# Patient Record
Sex: Female | Born: 1985 | Race: White | Hispanic: No | Marital: Single | Smoking: Former smoker
Health system: Southern US, Community
[De-identification: ages and names within clinical notes are randomized; demographics above are authoritative.]

## PROBLEM LIST (undated history)

## (undated) DIAGNOSIS — D649 Anemia, unspecified: Secondary | ICD-10-CM

## (undated) DIAGNOSIS — F329 Major depressive disorder, single episode, unspecified: Secondary | ICD-10-CM

## (undated) DIAGNOSIS — F32A Depression, unspecified: Secondary | ICD-10-CM

## (undated) DIAGNOSIS — N809 Endometriosis, unspecified: Secondary | ICD-10-CM

## (undated) DIAGNOSIS — F419 Anxiety disorder, unspecified: Secondary | ICD-10-CM

## (undated) DIAGNOSIS — J189 Pneumonia, unspecified organism: Secondary | ICD-10-CM

## (undated) HISTORY — PX: TONSILLECTOMY: SUR1361

## (undated) HISTORY — PX: EXCISION OF ENDOMETRIOMA: SHX6473

---

## 2012-06-30 HISTORY — PX: WISDOM TOOTH EXTRACTION: SHX21

## 2013-07-22 ENCOUNTER — Other Ambulatory Visit (HOSPITAL_COMMUNITY)
Admission: RE | Admit: 2013-07-22 | Discharge: 2013-07-22 | Disposition: A | Discharge status: Home / Self Care | Payer: BC Managed Care – PPO | Source: Ambulatory Visit | Attending: Physician Assistant | Admitting: Physician Assistant

## 2013-07-22 DIAGNOSIS — Z1151 Encounter for screening for human papillomavirus (HPV): Secondary | ICD-10-CM | POA: Insufficient documentation

## 2013-07-22 DIAGNOSIS — Z124 Encounter for screening for malignant neoplasm of cervix: Secondary | ICD-10-CM | POA: Insufficient documentation

## 2014-06-30 DIAGNOSIS — J189 Pneumonia, unspecified organism: Secondary | ICD-10-CM

## 2014-06-30 HISTORY — DX: Pneumonia, unspecified organism: J18.9

## 2015-06-28 ENCOUNTER — Encounter (HOSPITAL_COMMUNITY): Payer: Self-pay | Admitting: Emergency Medicine

## 2015-06-28 ENCOUNTER — Inpatient Hospital Stay (HOSPITAL_COMMUNITY)
Admission: AD | Admit: 2015-06-28 | Discharge: 2015-07-01 | DRG: 760 | Disposition: A | Discharge status: Home / Self Care | Payer: BLUE CROSS/BLUE SHIELD | Attending: Obstetrics & Gynecology | Admitting: Obstetrics & Gynecology

## 2015-06-28 ENCOUNTER — Emergency Department (HOSPITAL_COMMUNITY): Payer: BLUE CROSS/BLUE SHIELD

## 2015-06-28 DIAGNOSIS — R102 Pelvic and perineal pain unspecified side: Secondary | ICD-10-CM

## 2015-06-28 DIAGNOSIS — R1031 Right lower quadrant pain: Secondary | ICD-10-CM | POA: Diagnosis not present

## 2015-06-28 DIAGNOSIS — A047 Enterocolitis due to Clostridium difficile: Secondary | ICD-10-CM | POA: Diagnosis present

## 2015-06-28 DIAGNOSIS — Y95 Nosocomial condition: Secondary | ICD-10-CM | POA: Diagnosis present

## 2015-06-28 DIAGNOSIS — D72829 Elevated white blood cell count, unspecified: Secondary | ICD-10-CM

## 2015-06-28 DIAGNOSIS — Z87891 Personal history of nicotine dependence: Secondary | ICD-10-CM

## 2015-06-28 DIAGNOSIS — N80129 Deep endometriosis of ovary, unspecified ovary: Secondary | ICD-10-CM | POA: Diagnosis present

## 2015-06-28 DIAGNOSIS — J189 Pneumonia, unspecified organism: Secondary | ICD-10-CM | POA: Diagnosis present

## 2015-06-28 DIAGNOSIS — B9689 Other specified bacterial agents as the cause of diseases classified elsewhere: Secondary | ICD-10-CM

## 2015-06-28 DIAGNOSIS — IMO0002 Reserved for concepts with insufficient information to code with codable children: Secondary | ICD-10-CM

## 2015-06-28 DIAGNOSIS — N760 Acute vaginitis: Secondary | ICD-10-CM

## 2015-06-28 DIAGNOSIS — N83201 Unspecified ovarian cyst, right side: Secondary | ICD-10-CM

## 2015-06-28 DIAGNOSIS — N83202 Unspecified ovarian cyst, left side: Secondary | ICD-10-CM

## 2015-06-28 DIAGNOSIS — R1030 Lower abdominal pain, unspecified: Secondary | ICD-10-CM

## 2015-06-28 DIAGNOSIS — N730 Acute parametritis and pelvic cellulitis: Secondary | ICD-10-CM | POA: Diagnosis present

## 2015-06-28 DIAGNOSIS — R112 Nausea with vomiting, unspecified: Secondary | ICD-10-CM

## 2015-06-28 DIAGNOSIS — N801 Endometriosis of ovary: Secondary | ICD-10-CM | POA: Diagnosis not present

## 2015-06-28 HISTORY — DX: Major depressive disorder, single episode, unspecified: F32.9

## 2015-06-28 HISTORY — DX: Depression, unspecified: F32.A

## 2015-06-28 HISTORY — DX: Endometriosis, unspecified: N80.9

## 2015-06-28 LAB — COMPREHENSIVE METABOLIC PANEL
ALBUMIN: 4.5 g/dL (ref 3.5–5.0)
ALT: 15 U/L (ref 14–54)
ANION GAP: 11 (ref 5–15)
AST: 20 U/L (ref 15–41)
Alkaline Phosphatase: 55 U/L (ref 38–126)
BUN: 11 mg/dL (ref 6–20)
CO2: 25 mmol/L (ref 22–32)
Calcium: 9.1 mg/dL (ref 8.9–10.3)
Chloride: 102 mmol/L (ref 101–111)
Creatinine, Ser: 0.96 mg/dL (ref 0.44–1.00)
GFR calc non Af Amer: >60 mL/min (ref >60)
GLUCOSE: 114 mg/dL — AB (ref 65–99)
POTASSIUM: 4 mmol/L (ref 3.5–5.1)
SODIUM: 138 mmol/L (ref 135–145)
Total Bilirubin: 0.5 mg/dL (ref 0.3–1.2)
Total Protein: 7.8 g/dL (ref 6.5–8.1)

## 2015-06-28 LAB — DIFFERENTIAL
Basophils Absolute: 0 10*3/uL (ref 0.0–0.1)
Basophils Relative: 0 %
EOS ABS: 0.1 10*3/uL (ref 0.0–0.7)
EOS PCT: 1 %
Lymphocytes Relative: 23 %
Lymphs Abs: 2.8 10*3/uL (ref 0.7–4.0)
MONO ABS: 0.4 10*3/uL (ref 0.1–1.0)
Monocytes Relative: 4 %
NEUTROS PCT: 72 %
Neutro Abs: 8.8 10*3/uL — ABNORMAL HIGH (ref 1.7–7.7)

## 2015-06-28 LAB — URINALYSIS, ROUTINE W REFLEX MICROSCOPIC
BILIRUBIN URINE: NEGATIVE
Glucose, UA: NEGATIVE mg/dL
Ketones, ur: 15 mg/dL — AB
Leukocytes, UA: NEGATIVE
NITRITE: NEGATIVE
PH: 5.5 (ref 5.0–8.0)
Protein, ur: NEGATIVE mg/dL
SPECIFIC GRAVITY, URINE: 1.036 — AB (ref 1.005–1.030)

## 2015-06-28 LAB — CBC
HEMATOCRIT: 42.8 % (ref 36.0–46.0)
HEMOGLOBIN: 14.1 g/dL (ref 12.0–15.0)
MCH: 28.3 pg (ref 26.0–34.0)
MCHC: 32.9 g/dL (ref 30.0–36.0)
MCV: 85.9 fL (ref 78.0–100.0)
Platelets: 357 10*3/uL (ref 150–400)
RBC: 4.98 MIL/uL (ref 3.87–5.11)
RDW: 12.7 % (ref 11.5–15.5)
WBC: 12.7 10*3/uL — ABNORMAL HIGH (ref 4.0–10.5)

## 2015-06-28 LAB — URINE MICROSCOPIC-ADD ON

## 2015-06-28 LAB — I-STAT BETA HCG BLOOD, ED (MC, WL, AP ONLY): I-stat hCG, quantitative: <5 m[IU]/mL (ref <5)

## 2015-06-28 LAB — WET PREP, GENITAL
Sperm: NONE SEEN
TRICH WET PREP: NONE SEEN
YEAST WET PREP: NONE SEEN

## 2015-06-28 LAB — LIPASE, BLOOD: LIPASE: 27 U/L (ref 11–51)

## 2015-06-28 MED ORDER — IOHEXOL 300 MG/ML  SOLN
100.0000 mL | Freq: Once | INTRAMUSCULAR | Status: AC | PRN
  Administered 2015-06-28: 100 mL via INTRAVENOUS

## 2015-06-28 MED ORDER — HYDROMORPHONE HCL 1 MG/ML IJ SOLN
1.0000 mg | Freq: Once | INTRAMUSCULAR | Status: AC
  Administered 2015-06-28: 1 mg via INTRAVENOUS
  Filled 2015-06-28: qty 1

## 2015-06-28 MED ORDER — MORPHINE SULFATE (PF) 4 MG/ML IV SOLN
4.0000 mg | Freq: Once | INTRAVENOUS | Status: AC
  Administered 2015-06-28: 4 mg via INTRAVENOUS
  Filled 2015-06-28: qty 1

## 2015-06-28 MED ORDER — SODIUM CHLORIDE 0.9 % IV BOLUS (SEPSIS)
1000.0000 mL | Freq: Once | INTRAVENOUS | Status: AC
  Administered 2015-06-28: 1000 mL via INTRAVENOUS

## 2015-06-28 MED ORDER — ONDANSETRON HCL 4 MG/2ML IJ SOLN
4.0000 mg | Freq: Once | INTRAMUSCULAR | Status: AC
  Administered 2015-06-28: 4 mg via INTRAVENOUS
  Filled 2015-06-28: qty 2

## 2015-06-28 NOTE — ED Notes (Signed)
Spoke with lab. States she will add differential blood test to blood previously sent down to the lab.

## 2015-06-28 NOTE — ED Notes (Signed)
Pt c/o stabbing low abdominal pain, emesis, onset 1 hour ago. No urinary symptoms.

## 2015-06-28 NOTE — ED Provider Notes (Signed)
CSN: [id number]     Arrival date & time 06/28/15  1841 History   First MD Initiated Contact with Patient 06/28/15 2107     Chief Complaint  Patient presents with  . Abdominal Pain     (Consider location/radiation/quality/duration/timing/severity/associated sxs/prior Treatment) HPI Comments: Selena Mann is a 29 y.o. female with a PMHx of endometriosis, who presents to the ED with complaints of sudden onset lower abdominal pain 3 hours. She states the pain is 7/10 constant stabbing across the lower abdomen and somewhat into the right upper quadrant, nonradiating, worse with sitting or movements, with no treatments tried prior to arrival. Associated symptoms include nausea, 2 episodes of nonbloody nonbilious emesis, and decreased passage of flatus since onset of symptoms. LMP was 2-3 weeks ago. She is sexually active with one female partner. Last meal was at 2 PM when she ate leftover Lamb and pita bread. She denies any fevers, chills, chest pain, shortness breath, diarrhea, constipation, melena, hematochezia, hematemesis, dysuria, hematuria, flank pain, vaginal bleeding or discharge, numbness, tingling, weakness, recent travel, recent antibiotic use, sick contacts, suspicious food intake, alcohol use, NSAID use, or prior abdominal surgeries.  Patient is a 29 y.o. female presenting with abdominal pain. The history is provided by the patient. No language interpreter was used.  Abdominal Pain Pain location:  LLQ, RLQ, suprapubic and RUQ Pain quality: stabbing   Pain radiates to:  Does not radiate Pain severity:  Moderate Onset quality:  Sudden Duration:  3 hours Timing:  Constant Progression:  Unchanged Chronicity:  New Context: not recent travel, not sick contacts and not suspicious food intake   Relieved by:  None tried Worsened by:  Movement Ineffective treatments:  None tried Associated symptoms: flatus (no flatus since onset), nausea and vomiting   Associated symptoms: no chest pain,  no chills, no constipation, no diarrhea, no dysuria, no fever, no hematemesis, no hematochezia, no hematuria, no melena, no shortness of breath, no vaginal bleeding and no vaginal discharge   Risk factors: no alcohol abuse, has not had multiple surgeries and no NSAID use     Past Medical History  Diagnosis Date  . Endometriosis    Past Surgical History  Procedure Laterality Date  . Tonsillectomy     History reviewed. No pertinent family history. Social History  Substance Use Topics  . Smoking status: Former Games developer  . Smokeless tobacco: None  . Alcohol Use: Yes     Comment: occasional   OB History    No data available     Review of Systems  Constitutional: Negative for fever and chills.  Respiratory: Negative for shortness of breath.   Cardiovascular: Negative for chest pain.  Gastrointestinal: Positive for nausea, vomiting, abdominal pain and flatus (no flatus since onset). Negative for diarrhea, constipation, blood in stool, melena, hematochezia and hematemesis.       +No flatus passed since onset of symptoms  Genitourinary: Negative for dysuria, hematuria, flank pain, vaginal bleeding and vaginal discharge.  Musculoskeletal: Negative for myalgias and arthralgias.  Skin: Negative for color change.  Allergic/Immunologic: Negative for immunocompromised state.  Neurological: Negative for weakness and numbness.  Psychiatric/Behavioral: Negative for confusion.   10 Systems reviewed and are negative for acute change except as noted in the HPI.    Allergies  Review of patient's allergies indicates no known allergies.  Home Medications   Prior to Admission medications   Not on File   BP 101/87 mmHg  Pulse 97  Temp(Src) 97.5 F (36.4 C) (Oral)  Resp 18  SpO2 99% Physical Exam  Constitutional: She is oriented to person, place, and time. Vital signs are normal. She appears well-developed and well-nourished.  Non-toxic appearance. No distress.  Afebrile, nontoxic, NAD    HENT:  Head: Normocephalic and atraumatic.  Mouth/Throat: Oropharynx is clear and moist and mucous membranes are normal.  Eyes: Conjunctivae and EOM are normal. Right eye exhibits no discharge. Left eye exhibits no discharge.  Neck: Normal range of motion. Neck supple.  Cardiovascular: Normal rate, regular rhythm, normal heart sounds and intact distal pulses.  Exam reveals no gallop and no friction rub.   No murmur heard. Pulmonary/Chest: Effort normal and breath sounds normal. No respiratory distress. She has no decreased breath sounds. She has no wheezes. She has no rhonchi. She has no rales.  Abdominal: Soft. Normal appearance and bowel sounds are normal. She exhibits no distension. There is tenderness in the right lower quadrant, suprapubic area and left lower quadrant. There is guarding (voluntary) and tenderness at McBurney's point. There is no rigidity, no rebound, no CVA tenderness and negative Murphy's sign.    Soft, nondistended, +BS throughout, with diffuse lower abdominal TTP most focally in the RLQ at mcburney's point with some voluntary guarding but no rebound or rigidity, +rovsing's sign, neg murphy's, no CVA TTP   Genitourinary: Uterus normal. Pelvic exam was performed with patient supine. There is no rash, tenderness or lesion on the right labia. There is no rash, tenderness or lesion on the left labia. Cervix exhibits no motion tenderness, no discharge and no friability. Right adnexum displays tenderness and fullness. Right adnexum displays no mass. Left adnexum displays tenderness and fullness. Left adnexum displays no mass. No erythema, tenderness or bleeding in the vagina. No signs of injury around the vagina. Vaginal discharge (scant thin clear) found.  Chaperone present for exam. No rashes, lesions, or tenderness to external genitalia. No erythema, injury, or tenderness to vaginal mucosa. Scan thin clear vaginal discharge without bleeding within vaginal vault. No adnexal masses  palpable although exam limited due to pain, moderate b/l adnexal tenderness and some fullness. No CMT, cervical friability, or discharge from cervical os. Uterus non-deviated, mobile, nonTTP, and without enlargement.    Musculoskeletal: Normal range of motion.  Neurological: She is alert and oriented to person, place, and time. She has normal strength. No sensory deficit.  Skin: Skin is warm, dry and intact. No rash noted.  Psychiatric: She has a normal mood and affect.  Nursing note and vitals reviewed.   ED Course  Procedures (including critical care time) Labs Review Labs Reviewed  WET PREP, GENITAL - Abnormal; Notable for the following:    Clue Cells Wet Prep HPF POC MANY (*)    WBC, Wet Prep HPF POC MANY (*)    All other components within normal limits  COMPREHENSIVE METABOLIC PANEL - Abnormal; Notable for the following:    Glucose, Bld 114 (*)    All other components within normal limits  CBC - Abnormal; Notable for the following:    WBC 12.7 (*)    All other components within normal limits  URINALYSIS, ROUTINE W REFLEX MICROSCOPIC (NOT AT [facility]) - Abnormal; Notable for the following:    Specific Gravity, Urine 1.036 (*)    Hgb urine dipstick SMALL (*)    Ketones, ur 15 (*)    All other components within normal limits  DIFFERENTIAL - Abnormal; Notable for the following:    Neutro Abs 8.8 (*)    All other components within normal limits  URINE  MICROSCOPIC-ADD ON - Abnormal; Notable for the following:    Squamous Epithelial / LPF 6-30 (*)    Bacteria, UA FEW (*)    All other components within normal limits  LIPASE, BLOOD  I-STAT BETA HCG BLOOD, ED ([facility], [facility], [facility] ONLY)  GC/CHLAMYDIA PROBE AMP ([place]) NOT AT [facility]  [facility] Imaging Review US Transvaginal Non-ob  06/29/2015  CLINICAL DATA:  Bilateral pelvic cystic masses noted on CT. Further evaluation requested. Initial encounter. EXAM: TRANSABDOMINAL AND TRANSVAGINAL ULTRASOUND OF PELVIS DOPPLER ULTRASOUND OF OVARIES TECHNIQUE:  Both transabdominal and transvaginal ultrasound examinations of the pelvis were performed. Transabdominal technique was performed for global imaging of the pelvis including uterus, ovaries, adnexal regions, and pelvic cul-de-sac. It was necessary to proceed with endovaginal exam following the transabdominal exam to visualize the ovaries in greater detail. Color and duplex Doppler ultrasound was utilized to evaluate blood flow to the ovaries. COMPARISON:  CT of the abdomen and pelvis performed 06/28/2015 FINDINGS: Uterus Measurements: 9.6 x 3.5 x 4.4 cm. No fibroids or other mass visualized. Endometrium Thickness: 1.2 cm.  No focal abnormality visualized. Right ovary Measurements: 6.3 x 5.6 x 5.3 cm. There is a large echogenic region within the right ovary, measuring 4.4 x 4.2 x 4.2 cm, which may reflect an endometrioma or possibly a hemorrhagic cyst. Left ovary Measurements: 13.0 x 3.6 x 6.1 cm. There is a larger complex cystic lesion at the left ovary, measuring 11.9 x 3.3 x 5.0 cm, with layering increased attenuation, most likely reflecting a large organizing hemorrhagic cyst. Pulsed Doppler evaluation of both ovaries demonstrates normal low-resistance arterial and venous waveforms. Other findings Trace free fluid seen within the pelvic cul-de-sac, likely physiologic in nature. IMPRESSION: Echogenic region at the right ovary, measuring 4.4 cm, and a larger complex cystic lesion at the left ovary, measuring 11.9 x 3.3 x 5.0 cm. The left-sided lesion reflects a large organizing hemorrhagic cyst. The right ovarian region may reflect an endometrioma or hemorrhagic cyst. Would consider follow-up pelvic ultrasound in 2-3 months to assess for gradual resolution. Electronically Signed   By: Roanna Raider M.D.   On: 06/29/2015 00:58   US Pelvis Complete  06/29/2015  CLINICAL DATA:  Bilateral pelvic cystic masses noted on CT. Further evaluation requested. Initial encounter. EXAM: TRANSABDOMINAL AND TRANSVAGINAL  ULTRASOUND OF PELVIS DOPPLER ULTRASOUND OF OVARIES TECHNIQUE: Both transabdominal and transvaginal ultrasound examinations of the pelvis were performed. Transabdominal technique was performed for global imaging of the pelvis including uterus, ovaries, adnexal regions, and pelvic cul-de-sac. It was necessary to proceed with endovaginal exam following the transabdominal exam to visualize the ovaries in greater detail. Color and duplex Doppler ultrasound was utilized to evaluate blood flow to the ovaries. COMPARISON:  CT of the abdomen and pelvis performed 06/28/2015 FINDINGS: Uterus Measurements: 9.6 x 3.5 x 4.4 cm. No fibroids or other mass visualized. Endometrium Thickness: 1.2 cm.  No focal abnormality visualized. Right ovary Measurements: 6.3 x 5.6 x 5.3 cm. There is a large echogenic region within the right ovary, measuring 4.4 x 4.2 x 4.2 cm, which may reflect an endometrioma or possibly a hemorrhagic cyst. Left ovary Measurements: 13.0 x 3.6 x 6.1 cm. There is a larger complex cystic lesion at the left ovary, measuring 11.9 x 3.3 x 5.0 cm, with layering increased attenuation, most likely reflecting a large organizing hemorrhagic cyst. Pulsed Doppler evaluation of both ovaries demonstrates normal low-resistance arterial and venous waveforms. Other findings Trace free fluid seen within the pelvic cul-de-sac, likely physiologic in nature.  IMPRESSION: Echogenic region at the right ovary, measuring 4.4 cm, and a larger complex cystic lesion at the left ovary, measuring 11.9 x 3.3 x 5.0 cm. The left-sided lesion reflects a large organizing hemorrhagic cyst. The right ovarian region may reflect an endometrioma or hemorrhagic cyst. Would consider follow-up pelvic ultrasound in 2-3 months to assess for gradual resolution. Electronically Signed   By: Roanna Raider M.D.   On: 06/29/2015 00:58   Ct Abdomen Pelvis W Contrast  06/28/2015  CLINICAL DATA:  29 year old female with right lower quadrant abdominal pain EXAM:  CT ABDOMEN AND PELVIS WITH CONTRAST TECHNIQUE: Multidetector CT imaging of the abdomen and pelvis was performed using the standard protocol following bolus administration of intravenous contrast. CONTRAST:  OMNIPAQUE IOHEXOL 300 MG/ML  SOLN COMPARISON:  None. FINDINGS: The visualized lung bases are clear. No intra-abdominal free air. Small free fluid. The liver, gallbladder, pancreas, spleen, adrenal glands, left kidney, visualized ureters, and urinary bladder appear unremarkable. There is a 3 mm nonobstructing right renal upper pole calculus. No hydronephrosis. The uterus is anteverted. There fluid containing structures within the pelvis presumably ovarian cyst measuring 6.1 x 5.3 cm on the right and 4.2 x 9.3 cm on the left. The left ovarian cyst demonstrates a thickened wall with smaller adjacent cystic lesions. There is inflammatory changes and stranding of the pelvic floor fat with extension of the inflammatory fluid throughout the abdomen. Findings may represent an infected complex ovarian cysts versus less likely hydrosalpinx. Correlation with clinical exam recommend. Pelvic ultrasound with Doppler interrogation of the ovaries is recommended for further evaluation and exclude associated ovarian torsion. MRI may provide additional information pending ultrasound findings. There is no evidence of bowel obstruction or inflammation. Normal appendix. The abdominal aorta and IVC appear patent. No portal venous gas identified. There is no adenopathy. Small fat containing umbilical hernia. The abdominal wall soft tissues appear unremarkable. The osseous structures are intact. IMPRESSION: Bilateral pelvic cystic masses with associated inflammatory changes. Findings may represent complex an infected ovarian cyst versus less likely pyosalpinx. Pelvic ultrasound with Doppler interrogation of the ovaries is recommended for better evaluation of the cystic lesions and documentation of flow within the ovaries. MRI may  provide additional evaluation depending on sonographic findings. No evidence of bowel obstruction or inflammation.  Normal appendix. Electronically Signed   By: Elgie Collard M.D.   On: 06/28/2015 23:13   [place] Art/ven Flow Abd Pelv Doppler  06/29/2015  CLINICAL DATA:  Bilateral pelvic cystic masses noted on CT. Further evaluation requested. Initial encounter. EXAM: TRANSABDOMINAL AND TRANSVAGINAL ULTRASOUND OF PELVIS DOPPLER ULTRASOUND OF OVARIES TECHNIQUE: Both transabdominal and transvaginal ultrasound examinations of the pelvis were performed. Transabdominal technique was performed for global imaging of the pelvis including uterus, ovaries, adnexal regions, and pelvic cul-de-sac. It was necessary to proceed with endovaginal exam following the transabdominal exam to visualize the ovaries in greater detail. Color and duplex Doppler ultrasound was utilized to evaluate blood flow to the ovaries. COMPARISON:  CT of the abdomen and pelvis performed 06/28/2015 FINDINGS: Uterus Measurements: 9.6 x 3.5 x 4.4 cm. No fibroids or other mass visualized. Endometrium Thickness: 1.2 cm.  No focal abnormality visualized. Right ovary Measurements: 6.3 x 5.6 x 5.3 cm. There is a large echogenic region within the right ovary, measuring 4.4 x 4.2 x 4.2 cm, which may reflect an endometrioma or possibly a hemorrhagic cyst. Left ovary Measurements: 13.0 x 3.6 x 6.1 cm. There is a larger complex cystic lesion at the left ovary, measuring 11.9  x 3.3 x 5.0 cm, with layering increased attenuation, most likely reflecting a large organizing hemorrhagic cyst. Pulsed Doppler evaluation of both ovaries demonstrates normal low-resistance arterial and venous waveforms. Other findings Trace free fluid seen within the pelvic cul-de-sac, likely physiologic in nature. IMPRESSION: Echogenic region at the right ovary, measuring 4.4 cm, and a larger complex cystic lesion at the left ovary, measuring 11.9 x 3.3 x 5.0 cm. The left-sided lesion  reflects a large organizing hemorrhagic cyst. The right ovarian region may reflect an endometrioma or hemorrhagic cyst. Would consider follow-up pelvic ultrasound in 2-3 months to assess for gradual resolution. Electronically Signed   By: Roanna Raider M.D.   On: 06/29/2015 00:58   I have personally reviewed and evaluated these images and lab results as part of my medical decision-making.   EKG Interpretation None      MDM   Final diagnoses:  Lower abdominal pain  RLQ abdominal pain  Non-intractable vomiting with nausea, vomiting of unspecified type  Leukocytosis  Pelvic cyst  Adnexal tenderness  BV (bacterial vaginosis)    29 y.o. female with sudden onset lower abd pain mostly in RLQ and n/v. No passage of flatus since onset of symptoms. On exam, diffuse lower abd tenderness with voluntary guarding, tenderness over mcburney's point. Beta HCG neg, lipase WNL, CMP WNL, and CBC showing leukocytosis of 12.7, will obtain differential to aid in evaluation. Discussed that this could be appendicitis vs pelvic etiology vs obstruction/perf, etc. Will proceed with CT imaging to eval for appendicitis. If CT neg, discussed that we'd need to proceed with pelvic exam to determine any other causes (i.e. Ovarian torsion, etc). Will give pain meds, fluids, and nausea meds. Will await CT and U/A, then reassess shortly.   11:38 PM U/A still pending. Differential returning showing ab neut elevated at 8.8 but no other changes, including no changes to neutrophil%. CT showing b/l pelvic cystic masses with inflammatory changes around these, which could be infected cysts vs pyosalpinx vs complex cysts. Pelvic exam performed which showed scant thin clear vaginal discharge without cervical friability or CMT, mildly tender in b/l adnexa. Will obtain pelvic u/s to further evaluate cysts and to eval for torsion. Will give more pain meds and reassess shortly.  12:01 AM Wet prep showing many clue cells and WBCs, no  yeast or trichomonas. Pt would potentially benefit from flagyl for BV although she's not having large quantity discharge or having symptoms of BV, but given current adnexal pain will likely treat. Pt is not sexually active with males, doubt GC/CT, will defer on treatment of these until results return and there is a known GC/CT infection. U/A with neg nitrites and leuks, 6-30 squamous, 0-5 WBC and few bacteria. Doubt UTI/pyelo. Pt in ultrasound now, will await results.   12:58 AM Care signed out to [facility] PA-C at shift change. U/S results pending. Please see nicole's notes for further documentation of care and dispo.  BP 133/78 mmHg  Pulse 97  Temp(Src) 98 F (36.7 C) (Oral)  Resp 20  SpO2 99%  Meds ordered this encounter  Medications  . sodium chloride 0.9 % bolus 1,000 mL    Sig:   . ondansetron (ZOFRAN) injection 4 mg    Sig:   . morphine 4 MG/ML injection 4 mg    Sig:   . HYDROmorphone (DILAUDID) injection 1 mg    Sig:   . iohexol (OMNIPAQUE) 300 MG/ML solution 100 mL    Sig:   . HYDROmorphone (DILAUDID) injection  1 mg    Sig:   . HYDROmorphone (DILAUDID) injection 1 mg    Sig:      Yuleni Burich Camprubi-Soms, PA-C 06/29/15 0102  Lavera Guise, MD 06/29/15 [id number]

## 2015-06-28 NOTE — ED Notes (Signed)
Patient transported to CT 

## 2015-06-28 NOTE — ED Notes (Signed)
PA at bedside.

## 2015-06-29 ENCOUNTER — Encounter (HOSPITAL_COMMUNITY): Payer: Self-pay

## 2015-06-29 DIAGNOSIS — Z87891 Personal history of nicotine dependence: Secondary | ICD-10-CM

## 2015-06-29 DIAGNOSIS — R1031 Right lower quadrant pain: Secondary | ICD-10-CM | POA: Diagnosis present

## 2015-06-29 DIAGNOSIS — A047 Enterocolitis due to Clostridium difficile: Secondary | ICD-10-CM | POA: Diagnosis not present

## 2015-06-29 DIAGNOSIS — N730 Acute parametritis and pelvic cellulitis: Secondary | ICD-10-CM | POA: Diagnosis present

## 2015-06-29 DIAGNOSIS — J189 Pneumonia, unspecified organism: Secondary | ICD-10-CM | POA: Diagnosis not present

## 2015-06-29 DIAGNOSIS — Y95 Nosocomial condition: Secondary | ICD-10-CM | POA: Diagnosis present

## 2015-06-29 DIAGNOSIS — N801 Endometriosis of ovary: Secondary | ICD-10-CM | POA: Diagnosis not present

## 2015-06-29 LAB — GENTAMICIN LEVEL, RANDOM: Gentamicin Rm: 1.7 ug/mL

## 2015-06-29 MED ORDER — HYDROMORPHONE HCL 1 MG/ML IJ SOLN
1.0000 mg | INTRAMUSCULAR | Status: DC | PRN
  Administered 2015-06-29 (×3): 1.5 mg via INTRAVENOUS
  Administered 2015-06-29: 2 mg via INTRAVENOUS
  Administered 2015-06-30 (×2): 1 mg via INTRAVENOUS
  Filled 2015-06-29 (×2): qty 2
  Filled 2015-06-29: qty 1
  Filled 2015-06-29 (×2): qty 2
  Filled 2015-06-29: qty 1

## 2015-06-29 MED ORDER — CLINDAMYCIN PHOSPHATE 900 MG/50ML IV SOLN
900.0000 mg | Freq: Three times a day (TID) | INTRAVENOUS | Status: DC
  Administered 2015-06-29 – 2015-06-30 (×3): 900 mg via INTRAVENOUS
  Filled 2015-06-29 (×4): qty 50

## 2015-06-29 MED ORDER — GENTAMICIN SULFATE 40 MG/ML IJ SOLN
Freq: Once | INTRAVENOUS | Status: AC
  Administered 2015-06-29: 04:00:00 via INTRAVENOUS
  Filled 2015-06-29: qty 10.5

## 2015-06-29 MED ORDER — DIPHENHYDRAMINE HCL 25 MG PO CAPS
50.0000 mg | ORAL_CAPSULE | Freq: Four times a day (QID) | ORAL | Status: DC | PRN
  Administered 2015-06-29: 50 mg via ORAL
  Filled 2015-06-29: qty 2

## 2015-06-29 MED ORDER — PRENATAL MULTIVITAMIN CH
1.0000 | ORAL_TABLET | Freq: Every day | ORAL | Status: DC
  Administered 2015-06-29 – 2015-06-30 (×2): 1 via ORAL
  Filled 2015-06-29 (×2): qty 1

## 2015-06-29 MED ORDER — ONDANSETRON HCL 4 MG/2ML IJ SOLN: 4.0000 mg | Freq: Four times a day (QID) | INTRAMUSCULAR | Status: DC | PRN

## 2015-06-29 MED ORDER — HYDROMORPHONE HCL 1 MG/ML IJ SOLN
1.0000 mg | Freq: Once | INTRAMUSCULAR | Status: AC
  Administered 2015-06-29: 1 mg via INTRAVENOUS
  Filled 2015-06-29: qty 1

## 2015-06-29 MED ORDER — KETOROLAC TROMETHAMINE 30 MG/ML IJ SOLN
30.0000 mg | Freq: Four times a day (QID) | INTRAMUSCULAR | Status: DC
  Administered 2015-06-29 – 2015-06-30 (×5): 30 mg via INTRAVENOUS
  Filled 2015-06-29 (×5): qty 1

## 2015-06-29 MED ORDER — LACTATED RINGERS IV SOLN
INTRAVENOUS | Status: DC
  Administered 2015-06-29 – 2015-06-30 (×5): via INTRAVENOUS

## 2015-06-29 MED ORDER — OXYCODONE-ACETAMINOPHEN 5-325 MG PO TABS: 1.0000 | ORAL_TABLET | ORAL | Status: DC | PRN

## 2015-06-29 MED ORDER — GENTAMICIN SULFATE 40 MG/ML IJ SOLN
420.0000 mg | INTRAVENOUS | Status: DC
  Administered 2015-06-30: 420 mg via INTRAVENOUS
  Filled 2015-06-29: qty 10.5

## 2015-06-29 NOTE — ED Notes (Signed)
[facility] at bedside 

## 2015-06-29 NOTE — Progress Notes (Addendum)
ANTIBIOTIC CONSULT NOTE - INITIAL  Pharmacy Consult for Gentamicin Indication: PID  No Known Allergies  Patient Measurements: Height: 5\' 4"  (162.6 cm) Weight: 160 lb (72.576 kg) IBW/kg (Calculated) : 54.7 Adjusted Body Weight: 60.1 Kg  Vital Signs: Temp: 98.8 F (37.1 C) (12/30 0339) Temp Source: Oral (12/30 0339) BP: 118/68 mmHg (12/30 0339) Pulse Rate: 99 (12/30 0339) Intake/Output from previous day:   Intake/Output from this shift:    Labs:  Recent Labs  06/28/15 1916  WBC 12.7*  HGB 14.1  PLT 357  CREATININE 0.96   Estimated Creatinine Clearance: 83.7 mL/min (by C-G formula based on Cr of 0.96). No results for input(s): VANCOTROUGH, VANCOPEAK, VANCORANDOM, GENTTROUGH, GENTPEAK, GENTRANDOM, TOBRATROUGH, TOBRAPEAK, TOBRARND, AMIKACINPEAK, AMIKACINTROU, AMIKACIN in the last 72 hours.   Microbiology: Recent Results (from the past 720 hour(s))  Wet prep, genital     Status: Abnormal   Collection Time: 06/28/15 11:36 PM  Result Value Ref Range Status   Yeast Wet Prep HPF POC NONE SEEN NONE SEEN Final   Trich, Wet Prep NONE SEEN NONE SEEN Final   Clue Cells Wet Prep HPF POC MANY (A) NONE SEEN Final   WBC, Wet Prep HPF POC MANY (A) NONE SEEN Final   Sperm NONE SEEN  Final    Medical History: Past Medical History  Diagnosis Date  . Endometriosis   . Depression     Medications:  Clindamycin 900 mg IV every 8 hours  Assessment: 29 yo G1P0010 non pregnant female with PMH of endometritis with sudden onset lower abdominal pain with nausea and vomiting. Pt has bilateral pelvic cystic masses with suspicion for hydrosalpinx or pyosalpinx. R/O PID with TOA. Pt to be admitted for IV antibiotics.  Goal of Therapy:  Gentamicin levels per extended-interval protocol   Plan:  Gentamicin 7 mg/kg every 24 hours  Serum gentamicin level 10 hours from initial dose to confirm extended-interval dosing. Monitor renal function per protocol  Arelia SneddonMason, Mary Anne 06/29/2015,4:23  AM  10 hour random Gentamicin level=1.647mcg/ml. Based on protocol, will continue with the extended interval Gent dosing. Plan: Gentamicin 420mg  IV q24h. Will continue to follow and adjust dose if change in renal function.

## 2015-06-29 NOTE — MAU Provider Note (Signed)
Author: Adam Phenix, MD Service: Obstetrics/Gynecology Author Type: Physician    Filed: 06/29/2015 3:47 AM Note Time: 06/29/2015 3:27 AM Status: Signed   Editor: Adam Phenix, MD (Physician)     Expand All Collapse All   Selena Mann is an 29 y.o. female. G1P0010 Patient's last menstrual period was 06/09/2015.  Selena Mann is a 29 y.o. female G1P0010 Patient's last menstrual period was 06/09/2015. with a PMHx of endometriosis from 12 years ago, not confirmed by surgery, who presented to [facility] with complaints of sudden onset lower abdominal pain about 1700 yesterday. She states the pain is 7/10 constant stabbing across the lower abdomen and somewhat into the right upper quadrant, nonradiating, worse with sitting or movements, with no treatments tried prior to arrival. Associated symptoms include nausea, 2 episodes of nonbloody nonbilious emesis, and decreased passage of flatus since onset of symptoms. LMP was 2-3 weeks ago. She is sexually active with one female partner. Last meal was at 2 PM when she ate leftover lamb and pita bread. Decreased appetite now.   Pertinent Gynecological History: Menses: regular every 28-30 days without intermenstrual spotting Contraception: female sexual partner Blood transfusions: none Sexually transmitted diseases: no past history Previous GYN Procedures: none  Last pap: normal    Menstrual History:  Patient's last menstrual period was 06/09/2015.    Past Medical History  Diagnosis Date  . Endometriosis   . Depression     Past Surgical History  Procedure Laterality Date  . Tonsillectomy      History reviewed. No pertinent family history.  Social History:  reports that she has quit smoking. She does not have any smokeless tobacco history on file. She reports that she drinks alcohol. She reports that she does not use illicit drugs.  Allergies: No Known Allergies    Review of Systems   Constitutional: Negative.  Respiratory: Negative.  Gastrointestinal: Positive for nausea, vomiting and abdominal pain.  Genitourinary: Negative.   Denies vaginal discharge, bleeding  She denies any fevers, chills, chest pain, shortness breath, diarrhea, constipation, melena, hematochezia, hematemesis, dysuria, hematuria, flank pain, vaginal bleeding or discharge, numbness, tingling, weakness, recent travel, recent antibiotic use, sick contacts, suspicious food intake, alcohol use, NSAID use, or prior  Blood pressure 123/67, pulse 95, temperature 98.3 F (36.8 C), temperature source Oral, resp. rate 18, height 5\' 4"  (1.626 m), weight 72.576 kg (160 lb), last menstrual period 06/09/2015, SpO2 97 %. Physical Exam  Vitals reviewed. Constitutional: She is oriented to person, place, and time. She appears well-developed. No distress.  Cardiovascular: Normal rate.  Respiratory: Effort normal. No respiratory distress.  GI: She exhibits no mass. There is tenderness (lower quadrants). There is guarding.  Genitourinary: No vaginal discharge found.  Mild CMT, no discrete masses, speculum exam was deferred as specimens were obtained at [facility]  Neurological: She is alert and oriented to person, place, and time.  Skin: Skin is warm and dry.  Psychiatric: She has a normal mood and affect. Her behavior is normal.   OB History    Gravida Para Term Preterm AB TAB SAB Ectopic Multiple Living   1 0 0 0 1 0 1 0 0 0         Lab Results Last 24 Hours    Results for orders placed or performed during the hospital encounter of 06/28/15 (from the past 24 hour(s))  Lipase, blood Status: None   Collection Time: 06/28/15 7:16 PM  Result Value Ref Range   Lipase 27 11 - 51 U/L  Comprehensive  metabolic panel Status: Abnormal   Collection Time: 06/28/15 7:16 PM  Result Value Ref Range   Sodium 138 135 - 145 mmol/L   Potassium 4.0 3.5 - 5.1 mmol/L    Chloride 102 101 - 111 mmol/L   CO2 25 22 - 32 mmol/L   Glucose, Bld 114 (H) 65 - 99 mg/dL   BUN 11 6 - 20 mg/dL   Creatinine, Ser 6.57 0.44 - 1.00 mg/dL   Calcium 9.1 8.9 - 84.6 mg/dL   Total Protein 7.8 6.5 - 8.1 g/dL   Albumin 4.5 3.5 - 5.0 g/dL   AST 20 15 - 41 U/L   ALT 15 14 - 54 U/L   Alkaline Phosphatase 55 38 - 126 U/L   Total Bilirubin 0.5 0.3 - 1.2 mg/dL   GFR calc non Af Amer >60 >60 mL/min   GFR calc Af Amer >60 >60 mL/min   Anion gap 11 5 - 15  CBC Status: Abnormal   Collection Time: 06/28/15 7:16 PM  Result Value Ref Range   WBC 12.7 (H) 4.0 - 10.5 K/uL   RBC 4.98 3.87 - 5.11 MIL/uL   Hemoglobin 14.1 12.0 - 15.0 g/dL   HCT 96.2 95.2 - 84.1 %   MCV 85.9 78.0 - 100.0 fL   MCH 28.3 26.0 - 34.0 pg   MCHC 32.9 30.0 - 36.0 g/dL   RDW 32.4 40.1 - 02.7 %   Platelets 357 150 - 400 K/uL  Differential Status: Abnormal   Collection Time: 06/28/15 7:16 PM  Result Value Ref Range   Neutrophils Relative % 72 %   Neutro Abs 8.8 (H) 1.7 - 7.7 K/uL   Lymphocytes Relative 23 %   Lymphs Abs 2.8 0.7 - 4.0 K/uL   Monocytes Relative 4 %   Monocytes Absolute 0.4 0.1 - 1.0 K/uL   Eosinophils Relative 1 %   Eosinophils Absolute 0.1 0.0 - 0.7 K/uL   Basophils Relative 0 %   Basophils Absolute 0.0 0.0 - 0.1 K/uL  I-Stat beta hCG blood, ED ([facility], [facility], [facility] only) Status: None   Collection Time: 06/28/15 7:24 PM  Result Value Ref Range   I-stat hCG, quantitative <5.0 <5 mIU/mL   Comment 3     Urinalysis, Routine w reflex microscopic (not at [facility]) Status: Abnormal   Collection Time: 06/28/15 11:13 PM  Result Value Ref Range   Color, Urine YELLOW YELLOW   APPearance CLEAR CLEAR   Specific Gravity, Urine 1.036 (H) 1.005 - 1.030   pH 5.5 5.0 - 8.0   Glucose, UA NEGATIVE NEGATIVE  mg/dL   Hgb urine dipstick SMALL (A) NEGATIVE   Bilirubin Urine NEGATIVE NEGATIVE   Ketones, ur 15 (A) NEGATIVE mg/dL   Protein, ur NEGATIVE NEGATIVE mg/dL   Nitrite NEGATIVE NEGATIVE   Leukocytes, UA NEGATIVE NEGATIVE  Urine microscopic-add on Status: Abnormal   Collection Time: 06/28/15 11:13 PM  Result Value Ref Range   Squamous Epithelial / LPF 6-30 (A) NONE SEEN   WBC, UA 0-5 0 - 5 WBC/hpf   RBC / HPF 6-30 0 - 5 RBC/hpf   Bacteria, UA FEW (A) NONE SEEN  Wet prep, genital Status: Abnormal   Collection Time: 06/28/15 11:36 PM  Result Value Ref Range   Yeast Wet Prep HPF POC NONE SEEN NONE SEEN   Trich, Wet Prep NONE SEEN NONE SEEN   Clue Cells Wet Prep HPF POC MANY (A) NONE SEEN   WBC, Wet Prep HPF POC MANY (A) NONE SEEN  Sperm NONE SEEN        Imaging Results (Last 48 hours)    US Transvaginal Non-ob  06/29/2015 CLINICAL DATA: Bilateral pelvic cystic masses noted on CT. Further evaluation requested. Initial encounter. EXAM: TRANSABDOMINAL AND TRANSVAGINAL ULTRASOUND OF PELVIS DOPPLER ULTRASOUND OF OVARIES TECHNIQUE: Both transabdominal and transvaginal ultrasound examinations of the pelvis were performed. Transabdominal technique was performed for global imaging of the pelvis including uterus, ovaries, adnexal regions, and pelvic cul-de-sac. It was necessary to proceed with endovaginal exam following the transabdominal exam to visualize the ovaries in greater detail. Color and duplex Doppler ultrasound was utilized to evaluate blood flow to the ovaries. COMPARISON: CT of the abdomen and pelvis performed 06/28/2015 FINDINGS: Uterus Measurements: 9.6 x 3.5 x 4.4 cm. No fibroids or other mass visualized. Endometrium Thickness: 1.2 cm. No focal abnormality visualized. Right ovary Measurements: 6.3 x 5.6 x 5.3 cm. There is a large echogenic region within the right ovary, measuring 4.4 x 4.2 x 4.2 cm,  which may reflect an endometrioma or possibly a hemorrhagic cyst. Left ovary Measurements: 13.0 x 3.6 x 6.1 cm. There is a larger complex cystic lesion at the left ovary, measuring 11.9 x 3.3 x 5.0 cm, with layering increased attenuation, most likely reflecting a large organizing hemorrhagic cyst. Pulsed Doppler evaluation of both ovaries demonstrates normal low-resistance arterial and venous waveforms. Other findings Trace free fluid seen within the pelvic cul-de-sac, likely physiologic in nature. IMPRESSION: Echogenic region at the right ovary, measuring 4.4 cm, and a larger complex cystic lesion at the left ovary, measuring 11.9 x 3.3 x 5.0 cm. The left-sided lesion reflects a large organizing hemorrhagic cyst. The right ovarian region may reflect an endometrioma or hemorrhagic cyst. Would consider follow-up pelvic ultrasound in 2-3 months to assess for gradual resolution. Electronically Signed By: Roanna Raider M.D. On: 06/29/2015 00:58   US Pelvis Complete  06/29/2015 CLINICAL DATA: Bilateral pelvic cystic masses noted on CT. Further evaluation requested. Initial encounter. EXAM: TRANSABDOMINAL AND TRANSVAGINAL ULTRASOUND OF PELVIS DOPPLER ULTRASOUND OF OVARIES TECHNIQUE: Both transabdominal and transvaginal ultrasound examinations of the pelvis were performed. Transabdominal technique was performed for global imaging of the pelvis including uterus, ovaries, adnexal regions, and pelvic cul-de-sac. It was necessary to proceed with endovaginal exam following the transabdominal exam to visualize the ovaries in greater detail. Color and duplex Doppler ultrasound was utilized to evaluate blood flow to the ovaries. COMPARISON: CT of the abdomen and pelvis performed 06/28/2015 FINDINGS: Uterus Measurements: 9.6 x 3.5 x 4.4 cm. No fibroids or other mass visualized. Endometrium Thickness: 1.2 cm. No focal abnormality visualized. Right ovary Measurements: 6.3 x 5.6 x 5.3 cm. There is a large echogenic region  within the right ovary, measuring 4.4 x 4.2 x 4.2 cm, which may reflect an endometrioma or possibly a hemorrhagic cyst. Left ovary Measurements: 13.0 x 3.6 x 6.1 cm. There is a larger complex cystic lesion at the left ovary, measuring 11.9 x 3.3 x 5.0 cm, with layering increased attenuation, most likely reflecting a large organizing hemorrhagic cyst. Pulsed Doppler evaluation of both ovaries demonstrates normal low-resistance arterial and venous waveforms. Other findings Trace free fluid seen within the pelvic cul-de-sac, likely physiologic in nature. IMPRESSION: Echogenic region at the right ovary, measuring 4.4 cm, and a larger complex cystic lesion at the left ovary, measuring 11.9 x 3.3 x 5.0 cm. The left-sided lesion reflects a large organizing hemorrhagic cyst. The right ovarian region may reflect an endometrioma or hemorrhagic cyst. Would consider follow-up pelvic ultrasound in 2-3 months  to assess for gradual resolution. Electronically Signed By: Roanna Raider M.D. On: 06/29/2015 00:58   Ct Abdomen Pelvis W Contrast  06/28/2015 CLINICAL DATA: 29 year old female with right lower quadrant abdominal pain EXAM: CT ABDOMEN AND PELVIS WITH CONTRAST TECHNIQUE: Multidetector CT imaging of the abdomen and pelvis was performed using the standard protocol following bolus administration of intravenous contrast. CONTRAST: OMNIPAQUE IOHEXOL 300 MG/ML SOLN COMPARISON: None. FINDINGS: The visualized lung bases are clear. No intra-abdominal free air. Small free fluid. The liver, gallbladder, pancreas, spleen, adrenal glands, left kidney, visualized ureters, and urinary bladder appear unremarkable. There is a 3 mm nonobstructing right renal upper pole calculus. No hydronephrosis. The uterus is anteverted. There fluid containing structures within the pelvis presumably ovarian cyst measuring 6.1 x 5.3 cm on the right and 4.2 x 9.3 cm on the left. The left ovarian cyst demonstrates a thickened wall with  smaller adjacent cystic lesions. There is inflammatory changes and stranding of the pelvic floor fat with extension of the inflammatory fluid throughout the abdomen. Findings may represent an infected complex ovarian cysts versus less likely hydrosalpinx. Correlation with clinical exam recommend. Pelvic ultrasound with Doppler interrogation of the ovaries is recommended for further evaluation and exclude associated ovarian torsion. MRI may provide additional information pending ultrasound findings. There is no evidence of bowel obstruction or inflammation. Normal appendix. The abdominal aorta and IVC appear patent. No portal venous gas identified. There is no adenopathy. Small fat containing umbilical hernia. The abdominal wall soft tissues appear unremarkable. The osseous structures are intact. IMPRESSION: Bilateral pelvic cystic masses with associated inflammatory changes. Findings may represent complex an infected ovarian cyst versus less likely pyosalpinx. Pelvic ultrasound with Doppler interrogation of the ovaries is recommended for better evaluation of the cystic lesions and documentation of flow within the ovaries. MRI may provide additional evaluation depending on sonographic findings. No evidence of bowel obstruction or inflammation. Normal appendix. Electronically Signed By: Elgie Collard M.D. On: 06/28/2015 23:13   [place] Art/ven Flow Abd Pelv Doppler  06/29/2015 CLINICAL DATA: Bilateral pelvic cystic masses noted on CT. Further evaluation requested. Initial encounter. EXAM: TRANSABDOMINAL AND TRANSVAGINAL ULTRASOUND OF PELVIS DOPPLER ULTRASOUND OF OVARIES TECHNIQUE: Both transabdominal and transvaginal ultrasound examinations of the pelvis were performed. Transabdominal technique was performed for global imaging of the pelvis including uterus, ovaries, adnexal regions, and pelvic cul-de-sac. It was necessary to proceed with endovaginal exam following the transabdominal exam to visualize the  ovaries in greater detail. Color and duplex Doppler ultrasound was utilized to evaluate blood flow to the ovaries. COMPARISON: CT of the abdomen and pelvis performed 06/28/2015 FINDINGS: Uterus Measurements: 9.6 x 3.5 x 4.4 cm. No fibroids or other mass visualized. Endometrium Thickness: 1.2 cm. No focal abnormality visualized. Right ovary Measurements: 6.3 x 5.6 x 5.3 cm. There is a large echogenic region within the right ovary, measuring 4.4 x 4.2 x 4.2 cm, which may reflect an endometrioma or possibly a hemorrhagic cyst. Left ovary Measurements: 13.0 x 3.6 x 6.1 cm. There is a larger complex cystic lesion at the left ovary, measuring 11.9 x 3.3 x 5.0 cm, with layering increased attenuation, most likely reflecting a large organizing hemorrhagic cyst. Pulsed Doppler evaluation of both ovaries demonstrates normal low-resistance arterial and venous waveforms. Other findings Trace free fluid seen within the pelvic cul-de-sac, likely physiologic in nature. IMPRESSION: Echogenic region at the right ovary, measuring 4.4 cm, and a larger complex cystic lesion at the left ovary, measuring 11.9 x 3.3 x 5.0 cm. The left-sided lesion reflects  a large organizing hemorrhagic cyst. The right ovarian region may reflect an endometrioma or hemorrhagic cyst. Would consider follow-up pelvic ultrasound in 2-3 months to assess for gradual resolution. Electronically Signed By: Roanna RaiderJeffery Chang M.D. On: 06/29/2015 00:58     Assessment/Plan: Abdominal pain and tenderness with bilateral pelvic cystic masses with normal US doppler studies making ovarian torsion unlikely. The elongated mass on the left is suspicious for hydrosalpinx or pyosalpinx. She stated a long history of occasional pelvic discomfort and abnormal US findings and had been told she had endometriosis. She may have PID with TOA and she should be hospitalized for observation and antibiotic therapy, and she agrees to the plan. If she does not respond exploratory  surgery would be considered.  Christiona Siddique 06/29/2015, 3:27 AM

## 2015-06-29 NOTE — MAU Note (Signed)
Pt arrived via [facility] from [facility], c/o constant, lower abdominal pain-stabbing in nature, since 6pm. Denies vag bleeding or discharge. Has experienced nausea and vomiting. Denies urinary s/s. LMP: 06/09/2015.

## 2015-06-29 NOTE — H&P (Signed)
Selena Mann is an 29 y.o. female. G1P0010 Patient's last menstrual period was 06/09/2015.  Selena Mann is a 29 y.o. female G1P0010 Patient's last menstrual period was 06/09/2015. with a PMHx of endometriosis from 12 years ago, not confirmed by surgery,  who presented to [facility] with complaints of sudden onset lower abdominal pain about 1700 yesterday. She states the pain is 7/10 constant stabbing across the lower abdomen and somewhat into the right upper quadrant, nonradiating, worse with sitting or movements, with no treatments tried prior to arrival. Associated symptoms include nausea, 2 episodes of nonbloody nonbilious emesis, and decreased passage of flatus since onset of symptoms. LMP was 2-3 weeks ago. She is sexually active with one female partner. Last meal was at 2 PM when she ate leftover lamb and pita bread. Decreased appetite now.   Pertinent Gynecological History: Menses: regular every 28-30 days without intermenstrual spotting Contraception: female sexual partner Blood transfusions: none Sexually transmitted diseases: no past history Previous GYN Procedures: none  Last pap: normal     Menstrual History:  Patient's last menstrual period was 06/09/2015.    Past Medical History  Diagnosis Date  . Endometriosis   . Depression     Past Surgical History  Procedure Laterality Date  . Tonsillectomy      History reviewed. No pertinent family history.  Social History:  reports that she has quit smoking. She does not have any smokeless tobacco history on file. She reports that she drinks alcohol. She reports that she does not use illicit drugs.  Allergies: No Known Allergies    Review of Systems  Constitutional: Negative.   Respiratory: Negative.   Gastrointestinal: Positive for nausea, vomiting and abdominal pain.  Genitourinary: Negative.        Denies vaginal discharge, bleeding  She denies any fevers, chills, chest pain, shortness breath, diarrhea, constipation,  melena, hematochezia, hematemesis, dysuria, hematuria, flank pain, vaginal bleeding or discharge, numbness, tingling, weakness, recent travel, recent antibiotic use, sick contacts, suspicious food intake, alcohol use, NSAID use, or prior  Blood pressure 123/67, pulse 95, temperature 98.3 F (36.8 C), temperature source Oral, resp. rate 18, height 5\' 4"  (1.626 m), weight 72.576 kg (160 lb), last menstrual period 06/09/2015, SpO2 97 %. Physical Exam  Vitals reviewed. Constitutional: She is oriented to person, place, and time. She appears well-developed. No distress.  Cardiovascular: Normal rate.   Respiratory: Effort normal. No respiratory distress.  GI: She exhibits no mass. There is tenderness (lower quadrants). There is guarding.  Genitourinary: No vaginal discharge found.  Mild CMT, no discrete masses, speculum exam was deferred as specimens were obtained at [facility]  Neurological: She is alert and oriented to person, place, and time.  Skin: Skin is warm and dry.  Psychiatric: She has a normal mood and affect. Her behavior is normal.   OB History    Gravida Para Term Preterm AB TAB SAB Ectopic Multiple Living   1 0 0 0 1 0 1 0 0 0        Results for orders placed or performed during the hospital encounter of 06/28/15 (from the past 24 hour(s))  Lipase, blood     Status: None   Collection Time: 06/28/15  7:16 PM  Result Value Ref Range   Lipase 27 11 - 51 U/L  Comprehensive metabolic panel     Status: Abnormal   Collection Time: 06/28/15  7:16 PM  Result Value Ref Range   Sodium 138 135 - 145 mmol/L   Potassium 4.0 3.5 - 5.1  mmol/L   Chloride 102 101 - 111 mmol/L   CO2 25 22 - 32 mmol/L   Glucose, Bld 114 (H) 65 - 99 mg/dL   BUN 11 6 - 20 mg/dL   Creatinine, Ser 1.61 0.44 - 1.00 mg/dL   Calcium 9.1 8.9 - 09.6 mg/dL   Total Protein 7.8 6.5 - 8.1 g/dL   Albumin 4.5 3.5 - 5.0 g/dL   AST 20 15 - 41 U/L   ALT 15 14 - 54 U/L   Alkaline Phosphatase 55 38 - 126 U/L   Total Bilirubin  0.5 0.3 - 1.2 mg/dL   GFR calc non Af Amer >60 >60 mL/min   GFR calc Af Amer >60 >60 mL/min   Anion gap 11 5 - 15  CBC     Status: Abnormal   Collection Time: 06/28/15  7:16 PM  Result Value Ref Range   WBC 12.7 (H) 4.0 - 10.5 K/uL   RBC 4.98 3.87 - 5.11 MIL/uL   Hemoglobin 14.1 12.0 - 15.0 g/dL   HCT 04.5 40.9 - 81.1 %   MCV 85.9 78.0 - 100.0 fL   MCH 28.3 26.0 - 34.0 pg   MCHC 32.9 30.0 - 36.0 g/dL   RDW 91.4 78.2 - 95.6 %   Platelets 357 150 - 400 K/uL  Differential     Status: Abnormal   Collection Time: 06/28/15  7:16 PM  Result Value Ref Range   Neutrophils Relative % 72 %   Neutro Abs 8.8 (H) 1.7 - 7.7 K/uL   Lymphocytes Relative 23 %   Lymphs Abs 2.8 0.7 - 4.0 K/uL   Monocytes Relative 4 %   Monocytes Absolute 0.4 0.1 - 1.0 K/uL   Eosinophils Relative 1 %   Eosinophils Absolute 0.1 0.0 - 0.7 K/uL   Basophils Relative 0 %   Basophils Absolute 0.0 0.0 - 0.1 K/uL  I-Stat beta hCG blood, ED ([facility], [facility], [facility] only)     Status: None   Collection Time: 06/28/15  7:24 PM  Result Value Ref Range   I-stat hCG, quantitative <5.0 <5 mIU/mL   Comment 3          Urinalysis, Routine w reflex microscopic (not at [facility])     Status: Abnormal   Collection Time: 06/28/15 11:13 PM  Result Value Ref Range   Color, Urine YELLOW YELLOW   APPearance CLEAR CLEAR   Specific Gravity, Urine 1.036 (H) 1.005 - 1.030   pH 5.5 5.0 - 8.0   Glucose, UA NEGATIVE NEGATIVE mg/dL   Hgb urine dipstick SMALL (A) NEGATIVE   Bilirubin Urine NEGATIVE NEGATIVE   Ketones, ur 15 (A) NEGATIVE mg/dL   Protein, ur NEGATIVE NEGATIVE mg/dL   Nitrite NEGATIVE NEGATIVE   Leukocytes, UA NEGATIVE NEGATIVE  Urine microscopic-add on     Status: Abnormal   Collection Time: 06/28/15 11:13 PM  Result Value Ref Range   Squamous Epithelial / LPF 6-30 (A) NONE SEEN   WBC, UA 0-5 0 - 5 WBC/hpf   RBC / HPF 6-30 0 - 5 RBC/hpf   Bacteria, UA FEW (A) NONE SEEN  Wet prep, genital     Status: Abnormal   Collection Time: 06/28/15  11:36 PM  Result Value Ref Range   Yeast Wet Prep HPF POC NONE SEEN NONE SEEN   Trich, Wet Prep NONE SEEN NONE SEEN   Clue Cells Wet Prep HPF POC MANY (A) NONE SEEN   WBC, Wet Prep HPF POC MANY (A) NONE SEEN  Sperm NONE SEEN     US Transvaginal Non-ob  06/29/2015  CLINICAL DATA:  Bilateral pelvic cystic masses noted on CT. Further evaluation requested. Initial encounter. EXAM: TRANSABDOMINAL AND TRANSVAGINAL ULTRASOUND OF PELVIS DOPPLER ULTRASOUND OF OVARIES TECHNIQUE: Both transabdominal and transvaginal ultrasound examinations of the pelvis were performed. Transabdominal technique was performed for global imaging of the pelvis including uterus, ovaries, adnexal regions, and pelvic cul-de-sac. It was necessary to proceed with endovaginal exam following the transabdominal exam to visualize the ovaries in greater detail. Color and duplex Doppler ultrasound was utilized to evaluate blood flow to the ovaries. COMPARISON:  CT of the abdomen and pelvis performed 06/28/2015 FINDINGS: Uterus Measurements: 9.6 x 3.5 x 4.4 cm. No fibroids or other mass visualized. Endometrium Thickness: 1.2 cm.  No focal abnormality visualized. Right ovary Measurements: 6.3 x 5.6 x 5.3 cm. There is a large echogenic region within the right ovary, measuring 4.4 x 4.2 x 4.2 cm, which may reflect an endometrioma or possibly a hemorrhagic cyst. Left ovary Measurements: 13.0 x 3.6 x 6.1 cm. There is a larger complex cystic lesion at the left ovary, measuring 11.9 x 3.3 x 5.0 cm, with layering increased attenuation, most likely reflecting a large organizing hemorrhagic cyst. Pulsed Doppler evaluation of both ovaries demonstrates normal low-resistance arterial and venous waveforms. Other findings Trace free fluid seen within the pelvic cul-de-sac, likely physiologic in nature. IMPRESSION: Echogenic region at the right ovary, measuring 4.4 cm, and a larger complex cystic lesion at the left ovary, measuring 11.9 x 3.3 x 5.0 cm. The  left-sided lesion reflects a large organizing hemorrhagic cyst. The right ovarian region may reflect an endometrioma or hemorrhagic cyst. Would consider follow-up pelvic ultrasound in 2-3 months to assess for gradual resolution. Electronically Signed   By: Roanna Raider M.D.   On: 06/29/2015 00:58   US Pelvis Complete  06/29/2015  CLINICAL DATA:  Bilateral pelvic cystic masses noted on CT. Further evaluation requested. Initial encounter. EXAM: TRANSABDOMINAL AND TRANSVAGINAL ULTRASOUND OF PELVIS DOPPLER ULTRASOUND OF OVARIES TECHNIQUE: Both transabdominal and transvaginal ultrasound examinations of the pelvis were performed. Transabdominal technique was performed for global imaging of the pelvis including uterus, ovaries, adnexal regions, and pelvic cul-de-sac. It was necessary to proceed with endovaginal exam following the transabdominal exam to visualize the ovaries in greater detail. Color and duplex Doppler ultrasound was utilized to evaluate blood flow to the ovaries. COMPARISON:  CT of the abdomen and pelvis performed 06/28/2015 FINDINGS: Uterus Measurements: 9.6 x 3.5 x 4.4 cm. No fibroids or other mass visualized. Endometrium Thickness: 1.2 cm.  No focal abnormality visualized. Right ovary Measurements: 6.3 x 5.6 x 5.3 cm. There is a large echogenic region within the right ovary, measuring 4.4 x 4.2 x 4.2 cm, which may reflect an endometrioma or possibly a hemorrhagic cyst. Left ovary Measurements: 13.0 x 3.6 x 6.1 cm. There is a larger complex cystic lesion at the left ovary, measuring 11.9 x 3.3 x 5.0 cm, with layering increased attenuation, most likely reflecting a large organizing hemorrhagic cyst. Pulsed Doppler evaluation of both ovaries demonstrates normal low-resistance arterial and venous waveforms. Other findings Trace free fluid seen within the pelvic cul-de-sac, likely physiologic in nature. IMPRESSION: Echogenic region at the right ovary, measuring 4.4 cm, and a larger complex cystic lesion  at the left ovary, measuring 11.9 x 3.3 x 5.0 cm. The left-sided lesion reflects a large organizing hemorrhagic cyst. The right ovarian region may reflect an endometrioma or hemorrhagic cyst. Would consider follow-up pelvic ultrasound in  2-3 months to assess for gradual resolution. Electronically Signed   By: Roanna Raider M.D.   On: 06/29/2015 00:58   Ct Abdomen Pelvis W Contrast  06/28/2015  CLINICAL DATA:  29 year old female with right lower quadrant abdominal pain EXAM: CT ABDOMEN AND PELVIS WITH CONTRAST TECHNIQUE: Multidetector CT imaging of the abdomen and pelvis was performed using the standard protocol following bolus administration of intravenous contrast. CONTRAST:  OMNIPAQUE IOHEXOL 300 MG/ML  SOLN COMPARISON:  None. FINDINGS: The visualized lung bases are clear. No intra-abdominal free air. Small free fluid. The liver, gallbladder, pancreas, spleen, adrenal glands, left kidney, visualized ureters, and urinary bladder appear unremarkable. There is a 3 mm nonobstructing right renal upper pole calculus. No hydronephrosis. The uterus is anteverted. There fluid containing structures within the pelvis presumably ovarian cyst measuring 6.1 x 5.3 cm on the right and 4.2 x 9.3 cm on the left. The left ovarian cyst demonstrates a thickened wall with smaller adjacent cystic lesions. There is inflammatory changes and stranding of the pelvic floor fat with extension of the inflammatory fluid throughout the abdomen. Findings may represent an infected complex ovarian cysts versus less likely hydrosalpinx. Correlation with clinical exam recommend. Pelvic ultrasound with Doppler interrogation of the ovaries is recommended for further evaluation and exclude associated ovarian torsion. MRI may provide additional information pending ultrasound findings. There is no evidence of bowel obstruction or inflammation. Normal appendix. The abdominal aorta and IVC appear patent. No portal venous gas identified. There is  no adenopathy. Small fat containing umbilical hernia. The abdominal wall soft tissues appear unremarkable. The osseous structures are intact. IMPRESSION: Bilateral pelvic cystic masses with associated inflammatory changes. Findings may represent complex an infected ovarian cyst versus less likely pyosalpinx. Pelvic ultrasound with Doppler interrogation of the ovaries is recommended for better evaluation of the cystic lesions and documentation of flow within the ovaries. MRI may provide additional evaluation depending on sonographic findings. No evidence of bowel obstruction or inflammation.  Normal appendix. Electronically Signed   By: Elgie Collard M.D.   On: 06/28/2015 23:13   [place] Art/ven Flow Abd Pelv Doppler  06/29/2015  CLINICAL DATA:  Bilateral pelvic cystic masses noted on CT. Further evaluation requested. Initial encounter. EXAM: TRANSABDOMINAL AND TRANSVAGINAL ULTRASOUND OF PELVIS DOPPLER ULTRASOUND OF OVARIES TECHNIQUE: Both transabdominal and transvaginal ultrasound examinations of the pelvis were performed. Transabdominal technique was performed for global imaging of the pelvis including uterus, ovaries, adnexal regions, and pelvic cul-de-sac. It was necessary to proceed with endovaginal exam following the transabdominal exam to visualize the ovaries in greater detail. Color and duplex Doppler ultrasound was utilized to evaluate blood flow to the ovaries. COMPARISON:  CT of the abdomen and pelvis performed 06/28/2015 FINDINGS: Uterus Measurements: 9.6 x 3.5 x 4.4 cm. No fibroids or other mass visualized. Endometrium Thickness: 1.2 cm.  No focal abnormality visualized. Right ovary Measurements: 6.3 x 5.6 x 5.3 cm. There is a large echogenic region within the right ovary, measuring 4.4 x 4.2 x 4.2 cm, which may reflect an endometrioma or possibly a hemorrhagic cyst. Left ovary Measurements: 13.0 x 3.6 x 6.1 cm. There is a larger complex cystic lesion at the left ovary, measuring 11.9 x 3.3 x 5.0 cm,  with layering increased attenuation, most likely reflecting a large organizing hemorrhagic cyst. Pulsed Doppler evaluation of both ovaries demonstrates normal low-resistance arterial and venous waveforms. Other findings Trace free fluid seen within the pelvic cul-de-sac, likely physiologic in nature. IMPRESSION: Echogenic region at the right ovary, measuring 4.4  cm, and a larger complex cystic lesion at the left ovary, measuring 11.9 x 3.3 x 5.0 cm. The left-sided lesion reflects a large organizing hemorrhagic cyst. The right ovarian region may reflect an endometrioma or hemorrhagic cyst. Would consider follow-up pelvic ultrasound in 2-3 months to assess for gradual resolution. Electronically Signed   By: Roanna RaiderJeffery  Chang M.D.   On: 06/29/2015 00:58    Assessment/Plan: Abdominal pain and tenderness with bilateral pelvic cystic masses with normal US doppler studies making ovarian torsion unlikely. The elongated mass on the left is suspicious for hydrosalpinx or pyosalpinx. She stated a long history of occasional pelvic discomfort and abnormal US findings and had been told she had endometriosis. She may have PID with TOA and she should be hospitalized for observation and antibiotic therapy, and she agrees to the plan. If she does not respond exploratory surgery would be considered.  ARNOLD,JAMES 06/29/2015, 3:27 AM

## 2015-06-29 NOTE — ED Notes (Signed)
PA at bedside.

## 2015-06-29 NOTE — ED Notes (Signed)
[facility] notified of the need of transport.

## 2015-06-29 NOTE — ED Notes (Addendum)
Patient requesting something to drink. PA requested to wait until ultrasound results before giving patient something to drink. Patient made aware.

## 2015-06-29 NOTE — ED Provider Notes (Signed)
PROGRESS NOTE                                                                                                                 This is a sign-out from PA Camrubi-Sommsat shift change: Selena Mann is a 29 y.o. female presenting with acute severe bilateral lower abdominal pain worse on the right. Please refer to previous note for full HPI, ROS, PMH and PE.   Ultrasound shows a right 5 cm complex cyst, there is also a left sided hemorrhagic cyst. Discussed with attending physician who is still concern for torsion due to escalating pain medication requirements.  Patient states she doesn't have an OB/GYN, [facility]  Ob/Gyn consult from Dr. Debroah LoopArnold appreciated: accepts transfer to [facility] for evaluation.   Selena Emeryicole Everett Ehrler, PA-C 06/29/15 [id number]  Selena Guiseana Duo Liu, MD 06/29/15 [phone]

## 2015-06-29 NOTE — ED Notes (Signed)
Ultrasound at bedside

## 2015-06-30 DIAGNOSIS — N80129 Deep endometriosis of ovary, unspecified ovary: Secondary | ICD-10-CM | POA: Diagnosis present

## 2015-06-30 DIAGNOSIS — N801 Endometriosis of ovary: Secondary | ICD-10-CM | POA: Diagnosis present

## 2015-06-30 LAB — CBC WITH DIFFERENTIAL/PLATELET
BASOS ABS: 0 10*3/uL (ref 0.0–0.1)
BASOS PCT: 0 %
EOS ABS: 0.1 10*3/uL (ref 0.0–0.7)
Eosinophils Relative: 1 %
HCT: 34.5 % — ABNORMAL LOW (ref 36.0–46.0)
Hemoglobin: 11.2 g/dL — ABNORMAL LOW (ref 12.0–15.0)
Lymphocytes Relative: 14 %
Lymphs Abs: 1.6 10*3/uL (ref 0.7–4.0)
MCH: 28.3 pg (ref 26.0–34.0)
MCHC: 32.5 g/dL (ref 30.0–36.0)
MCV: 87.1 fL (ref 78.0–100.0)
MONO ABS: 0.5 10*3/uL (ref 0.1–1.0)
MONOS PCT: 4 %
NEUTROS ABS: 9.6 10*3/uL — AB (ref 1.7–7.7)
NEUTROS PCT: 81 %
Platelets: 265 10*3/uL (ref 150–400)
RBC: 3.96 MIL/uL (ref 3.87–5.11)
RDW: 13.3 % (ref 11.5–15.5)
WBC: 11.9 10*3/uL — ABNORMAL HIGH (ref 4.0–10.5)

## 2015-06-30 LAB — GC/CHLAMYDIA PROBE AMP (~~LOC~~) NOT AT ARMC
CHLAMYDIA, DNA PROBE: NEGATIVE
NEISSERIA GONORRHEA: NEGATIVE

## 2015-06-30 LAB — SEDIMENTATION RATE: SED RATE: 23 mm/h — AB (ref 0–22)

## 2015-06-30 MED ORDER — ESCITALOPRAM OXALATE 20 MG PO TABS
20.0000 mg | ORAL_TABLET | Freq: Every day | ORAL | Status: DC
  Administered 2015-06-30: 20 mg via ORAL
  Filled 2015-06-30: qty 1

## 2015-06-30 MED ORDER — HYDROMORPHONE HCL 2 MG PO TABS
2.0000 mg | ORAL_TABLET | ORAL | Status: DC | PRN
  Administered 2015-06-30 – 2015-07-01 (×4): 2 mg via ORAL
  Filled 2015-06-30 (×4): qty 1

## 2015-06-30 MED ORDER — KETOROLAC TROMETHAMINE 10 MG PO TABS
10.0000 mg | ORAL_TABLET | Freq: Three times a day (TID) | ORAL | Status: DC
  Administered 2015-06-30 – 2015-07-01 (×3): 10 mg via ORAL
  Filled 2015-06-30 (×4): qty 1

## 2015-06-30 NOTE — Progress Notes (Signed)
Bilateral endometriomas  Subjective: Patient reports on going intense abdominal pelvic pain requiring IV pain meds Some nausea Needs assistance going to the BR.    Objective: I have reviewed patient's vital signs, intake and output, medications, labs and radiology results.  General: alert, cooperative and no distress GI: soft flat but moderate tenderness bilateral +/- rebound   Assessment/Plan: Endometriomas, bilateral  ESR 23 WBC 9K and sonogram all inconsistent with bilateral TOA/PID process Additionally pt is in a long term monogamous relationship   Pt has such significant pain I am unsure she can be managed as an outpatient, I will try  To transition her to oral today and evaluate her response Antibiotics stopped Ultimately pt needs surgical management of the endometriomas/endometriosis followed by lupron therapy post op, which would give the patient her best disease and symptom management, lupron alone will be inadequate with bilateral endometriomas Pt wants to preserve her ovaries for now if possible, she doubts she will ever want to become pregnant and carry a child herself      LOS: 1 day    EURE,LUTHER H 06/30/2015, 11:18 AM

## 2015-07-01 MED ORDER — DIPHENHYDRAMINE HCL 50 MG PO CAPS: 50.0000 mg | ORAL_CAPSULE | Freq: Four times a day (QID) | ORAL | Status: DC | PRN

## 2015-07-01 MED ORDER — KETOROLAC TROMETHAMINE 10 MG PO TABS: 10.0000 mg | ORAL_TABLET | Freq: Three times a day (TID) | ORAL | Status: DC

## 2015-07-01 MED ORDER — HYDROMORPHONE HCL 2 MG PO TABS: 2.0000 mg | ORAL_TABLET | ORAL | Status: DC | PRN

## 2015-07-01 NOTE — Discharge Summary (Signed)
Physician Discharge Summary  Patient ID: Selena Mann MRN: [id number] DOB/AGE: Nov 27, 1985 30 y.o.  Admit date: 06/28/2015 Discharge date: 07/01/2015  Admission Diagnoses: Bilateral endometriomas, large Severe pelvic abdominal pain  Discharge Diagnoses:  Bilateral endometriomas LML pneumonia + C difficile toxin  Discharged Condition: stable  Hospital Course: from a purely operative standpoint the pt did well However on postop day 1 she developed a fever and was started on antibiotics. A chest x-ray confirmed a left pneumonia and she was begun on Zosyn and vancomycin for health care associated pneumonia.  It is noted that she had been in the hospital a week prior [facility] that's why she was treated as a health care associated pneumonia.  Additionally she had some loose bowel movements not had a C. difficile toxin which was positive and placed her on Flagyl.  At the time of discharge she was discharged 9 Augmentin and Flagyl for pneumonia and C. difficile toxin respectively  Consults: None  Significant Diagnostic Studies: labs: c diff, chest x ray  Treatments: antibiotics: vancomycin, Zosyn and metronidazole and surgery: exp lap with excision of bilateral endometriomas  Discharge Exam: Blood pressure 103/60, pulse 87, temperature 98 F (36.7 C), temperature source Oral, resp. rate 18, height 5\' 4"  (1.626 m), weight 160 lb (72.576 kg), last menstrual period 06/09/2015, SpO2 100 %. General appearance: alert, cooperative and no distress Resp: diminished breath sounds LLL GI: soft, non-tender; bowel sounds normal; no masses,  no organomegaly Incision/Wound:clean dry intact  Disposition: Final discharge disposition not confirmed  Discharge Instructions    Call MD for:  persistant nausea and vomiting    Complete by:  As directed      Call MD for:  severe uncontrolled pain    Complete by:  As directed      Call MD for:  temperature >100.4    Complete by:  As directed       Diet - low sodium heart healthy    Complete by:  As directed      Driving Restrictions    Complete by:  As directed   No driving while taking dilaudid     Increase activity slowly    Complete by:  As directed      Lifting restrictions    Complete by:  As directed   No more than 10 pounds            Medication List    STOP taking these medications        ibuprofen 200 MG tablet  Commonly known as:  ADVIL,MOTRIN      TAKE these medications        dextromethorphan-guaiFENesin 30-600 MG 12hr tablet  Commonly known as:  MUCINEX DM  Take 1 tablet by mouth 2 (two) times daily as needed for cough.     diphenhydrAMINE 50 MG capsule  Commonly known as:  BENADRYL  Take 1 capsule (50 mg total) by mouth every 6 (six) hours as needed for itching.     escitalopram 20 MG tablet  Commonly known as:  LEXAPRO  Take 20 mg by mouth daily.     Fish Oil 1000 MG Caps  Take 1,000 mg by mouth daily.     FLUARIX QUADRIVALENT 0.5 ML injection  Generic drug:  Influenza vac split quadrivalent PF  Inject 1 Dose as directed once.     guaiFENesin 100 MG/5ML liquid  Commonly known as:  ROBITUSSIN  Take 200 mg by mouth 3 (three) times daily as needed for cough.  HYDROmorphone 2 MG tablet  Commonly known as:  DILAUDID  Take 1-2 tablets (2-4 mg total) by mouth every 4 (four) hours as needed for severe pain.     ketorolac 10 MG tablet  Commonly known as:  TORADOL  Take 1 tablet (10 mg total) by mouth every 8 (eight) hours.     loratadine 10 MG tablet  Commonly known as:  CLARITIN  Take 10 mg by mouth daily.     multivitamin with minerals Tabs tablet  Take 1 tablet by mouth daily.     PRESCRIPTION MEDICATION  Take 1 capsule by mouth daily. axathanthin           Follow-up Information    Follow up with Lazaro ArmsEURE,LUTHER H, MD On 07/04/2015.   Specialties:  Obstetrics and Gynecology, Radiology   Why:  surgery is scheduled   Contact information:   [address] [place]  [id number] [phone]       Signed: Lazaro ArmsURE,LUTHER H 07/01/2015, 7:52 AM

## 2015-07-01 NOTE — Progress Notes (Signed)
Discharge instructions and followup care reviewed with pt and significant other, all questions answered.  IV removed upon discharge no equipment sent home with patient.  All belongings at the bedside and sent home with pt.  She was in stable condition and wheeled out by NT to the front entrance, discharged to home with significant other.

## 2015-07-01 NOTE — Discharge Instructions (Signed)
Endometriosis Endometriosis is a condition in which the tissue that lines the uterus (endometrium) grows outside of its normal location. The tissue may grow in many locations close to the uterus, but it commonly grows on the ovaries, fallopian tubes, vagina, or bowel. Because the uterus expels, or sheds, its lining every menstrual cycle, there is bleeding wherever the endometrial tissue is located. This can cause pain because blood is irritating to tissues not normally exposed to it.  CAUSES  The cause of endometriosis is not known.  SIGNS AND SYMPTOMS  Often, there are no symptoms. When symptoms are present, they can vary with the location of the displaced tissue. Various symptoms can occur at different times. Although symptoms occur mainly during a woman's menstrual period, they can also occur midcycle and usually stop with menopause. Some people may go months with no symptoms at all. Symptoms may include:   Back or abdominal pain.   Heavier bleeding during periods.   Pain during intercourse.   Painful bowel movements.   Infertility. DIAGNOSIS  Your health care provider will do a physical exam and ask about your symptoms. Various tests may be done, such as:   Blood tests and urine tests. These are done to help rule out other problems.   Ultrasound. This test is done to look for abnormal tissue.   An X-ray of the lower bowel (barium enema).  Laparoscopy. In this procedure, a thin, lighted tube with a tiny camera on the end (laparoscope) is inserted into your abdomen. This helps your health care provider look for abnormal tissue to confirm the diagnosis. The health care provider may also remove a small piece of tissue (biopsy) from any abnormal tissue found. This tissue sample can then be sent to a lab so it can be looked at under a microscope. TREATMENT  Treatment will vary and may include:   Medicines to relieve pain. Nonsteroidal anti-inflammatory drugs (NSAIDs) are a type of  pain medicine that can help to relieve the pain caused by endometriosis.  Hormonal therapy. When using hormonal therapy, periods are eliminated. This eliminates the monthly exposure to blood by the displaced endometrial tissue.   Surgery. Surgery may sometimes be done to remove the abnormal endometrial tissue. In severe cases, surgery may be done to remove the fallopian tubes, uterus, and ovaries (hysterectomy). HOME CARE INSTRUCTIONS   Take all medicines as directed by your health care provider. Do not take aspirin because it may increase bleeding when you are not on hormonal therapy.   Avoid activities that produce pain, including sexual activity. SEEK MEDICAL CARE IF:  You have pelvic pain before, after, or during your periods.  You have pelvic pain between periods that gets worse during your period.  You have pelvic pain during or after sex.  You have pelvic pain with bowel movements or urination, especially during your period.  You have problems getting pregnant.  You have a fever. SEEK IMMEDIATE MEDICAL CARE IF:   Your pain is severe and is not responding to pain medicine.   You have severe nausea and vomiting, or you cannot keep foods down.   You have pain that is limited to the right lower part of your abdomen.   You have swelling or increasing pain in your abdomen.   You see blood in your stool.  MAKE SURE YOU:   Understand these instructions.  Will watch your condition.  Will get help right away if you are not doing well or get worse.   This information   is not intended to replace advice given to you by your health care provider. Make sure you discuss any questions you have with your health care provider.   Document Released: 06/13/2000 Document Revised: 07/07/2014 Document Reviewed: 02/11/2013 [employer] Interactive Patient Education 2016 [employer]  

## 2015-07-02 ENCOUNTER — Other Ambulatory Visit: Payer: Self-pay | Admitting: Obstetrics & Gynecology

## 2015-07-03 ENCOUNTER — Encounter (HOSPITAL_COMMUNITY)
Admission: RE | Admit: 2015-07-03 | Discharge: 2015-07-03 | Disposition: A | Discharge status: Home / Self Care | Payer: BLUE CROSS/BLUE SHIELD | Source: Ambulatory Visit | Attending: Obstetrics & Gynecology | Admitting: Obstetrics & Gynecology

## 2015-07-03 ENCOUNTER — Encounter (HOSPITAL_COMMUNITY): Payer: Self-pay

## 2015-07-03 DIAGNOSIS — R109 Unspecified abdominal pain: Secondary | ICD-10-CM | POA: Insufficient documentation

## 2015-07-03 DIAGNOSIS — N801 Endometriosis of ovary: Secondary | ICD-10-CM | POA: Diagnosis not present

## 2015-07-03 DIAGNOSIS — Z01818 Encounter for other preprocedural examination: Secondary | ICD-10-CM | POA: Insufficient documentation

## 2015-07-03 LAB — COMPREHENSIVE METABOLIC PANEL
ALBUMIN: 3.5 g/dL (ref 3.5–5.0)
ALK PHOS: 109 U/L (ref 38–126)
ALT: 31 U/L (ref 14–54)
ANION GAP: 8 (ref 5–15)
AST: 25 U/L (ref 15–41)
BILIRUBIN TOTAL: 0.3 mg/dL (ref 0.3–1.2)
BUN: 8 mg/dL (ref 6–20)
CALCIUM: 9.3 mg/dL (ref 8.9–10.3)
CO2: 20 mmol/L — AB (ref 22–32)
Chloride: 111 mmol/L (ref 101–111)
Creatinine, Ser: 0.71 mg/dL (ref 0.44–1.00)
GFR calc Af Amer: >60 mL/min (ref >60)
GFR calc non Af Amer: >60 mL/min (ref >60)
GLUCOSE: 92 mg/dL (ref 65–99)
Potassium: 4.3 mmol/L (ref 3.5–5.1)
SODIUM: 139 mmol/L (ref 135–145)
TOTAL PROTEIN: 6.5 g/dL (ref 6.5–8.1)

## 2015-07-03 LAB — CBC
HEMATOCRIT: 40 % (ref 36.0–46.0)
Hemoglobin: 13.2 g/dL (ref 12.0–15.0)
MCH: 28.6 pg (ref 26.0–34.0)
MCHC: 33 g/dL (ref 30.0–36.0)
MCV: 86.6 fL (ref 78.0–100.0)
Platelets: 338 10*3/uL (ref 150–400)
RBC: 4.62 MIL/uL (ref 3.87–5.11)
RDW: 13 % (ref 11.5–15.5)
WBC: 9.9 10*3/uL (ref 4.0–10.5)

## 2015-07-03 LAB — HCG, QUANTITATIVE, PREGNANCY: hCG, Beta Chain, Quant, S: <1 m[IU]/mL (ref <5)

## 2015-07-03 NOTE — Patient Instructions (Signed)
Selena Mann  07/03/2015     @PREFPERIOPPHARMACY @   Your procedure is scheduled on  07/04/2015   Report to [facility] at  615  A.M.  Call this number if you have problems the morning of surgery:  [phone]   Remember:  Do not eat food or drink liquids after midnight.  Take these medicines the morning of surgery with A SIP OF WATER lexapro, dilaudid, toradol, claritin.   Do not wear jewelry, make-up or nail polish.  Do not wear lotions, powders, or perfumes.  You may wear deodorant.  Do not shave 48 hours prior to surgery.  Men may shave face and neck.  Do not bring valuables to the hospital.  [facility] is not responsible for any belongings or valuables.  Contacts, dentures or bridgework may not be worn into surgery.  Leave your suitcase in the car.  After surgery it may be brought to your room.  For patients admitted to the hospital, discharge time will be determined by your treatment team.  Patients discharged the day of surgery will not be allowed to drive home.   Name and phone number of your driver:   family Special instructions:  none  Please read over the following fact sheets that you were given. Pain Booklet, Coughing and Deep Breathing, Blood Transfusion Information, Surgical Site Infection Prevention, Anesthesia Post-op Instructions and Care and Recovery After Surgery      Bilateral Salpingo-Oophorectomy Bilateral salpingo-oophorectomy is the surgical removal of both fallopian tubes and both ovaries. The ovaries are small organs that produce eggs in women. The fallopian tubes transport the egg from the ovary to the womb (uterus). Usually, when this surgery is done, the uterus was previously removed. A bilateral salpingo-oophorectomy may be done to treat cancer or to reduce the risk of cancer in women who are at high risk. Removing both fallopian tubes and both ovaries will make you unable to become pregnant (sterile). It will also put  you into menopause so that you will no longer have menstrual periods and may have menopausal symptoms such as hot flashes, night sweats, and mood changes. It will not affect your sex drive. LET [facility] CARE PROVIDER KNOW ABOUT:  Any allergies you have.  All medicines you are taking, including vitamins, herbs, eye drops, creams, and over-the-counter medicines.  Previous problems you or members of your family have had with the use of anesthetics.  Any blood disorders you have.  Previous surgeries you have had.  Medical conditions you have. RISKS AND COMPLICATIONS Generally, this is a safe procedure. However, as with any procedure, complications can occur. Possible complications include:  Injury to surrounding organs.  Bleeding.  Infection.  Blood clots in the legs or lungs.  Problems related to anesthesia. BEFORE THE PROCEDURE  Ask your health care provider about changing or stopping your regular medicines. You may need to stop taking certain medicines, such as aspirin or blood thinners, at least 1 week before the surgery.  Do not eat or drink anything for at least 8 hours before the surgery.  If you smoke, do not smoke for at least 2 weeks before the surgery.  Make plans to have someone drive you home after the procedure or after your hospital stay. Also arrange for someone to help you with activities during recovery. PROCEDURE   You will be given medicine to help you relax before the procedure (sedative). You will  then be given medicine to make you sleep through the procedure (general anesthetic). These medicines will be given through an IV access tube that is put into one of your veins.  Once you are asleep, your lower abdomen will be shaved and cleaned. A thin, flexible tube (catheter) will be placed in your bladder.  The surgeon may use a laparoscopic, robotic, or open technique for this surgery:  In the laparoscopic technique, the surgery is done through two small  cuts (incisions) in the abdomen. A thin, lighted tube with a tiny camera on the end (laparoscope) is inserted into one of the incisions. The tools needed for the procedure are put through the other incision.  A robotic technique may be chosen to perform complex surgery in a small space. In the robotic technique, small incisions will be made. A camera and surgical instruments are passed through the incisions. Surgical instruments will be controlled with the help of a robotic arm.  In the open technique, the surgery is done through one large incision in the abdomen.  Using any of these techniques, the surgeon removes the fallopian tubes and ovaries. The blood vessels will be clamped and tied.  The surgeon then uses staples or stitches to close the incision or incisions. AFTER THE PROCEDURE  You will be taken to a recovery area where you will be monitored for 1 to 3 hours. Your blood pressure, pulse, and temperature will be checked often. You will remain in the recovery area until you are stable and waking up.  If the laparoscopic technique was used, you may be allowed to go home after several hours. You may have some shoulder pain after the laparoscopic procedure. This is normal and usually goes away in a day or two.  If the open technique was used, you will be admitted to the hospital for a couple of days.  You will be given pain medicine as needed.  The IV access tube and catheter will be removed before you are discharged.   This information is not intended to replace advice given to you by your health care provider. Make sure you discuss any questions you have with your health care provider.   Document Released: 06/16/2005 Document Revised: 06/21/2013 Document Reviewed: 12/08/2012 Elsevier Interactive Patient Education [employer]2016 Else[employer] Inc. Hysterectomy Information  A hysterectomy is a surgery in which your uterus is removed. This surgery may be done to treat various medical problems. After  the surgery, you will no longer have menstrual periods. The surgery will also make you unable to become pregnant (sterile). The fallopian tubes and ovaries can be removed (bilateral salpingo-oophorectomy) during this surgery as well.  REASONS FOR A HYSTERECTOMY  Persistent, abnormal bleeding.  Lasting (chronic) pelvic pain or infection.  The lining of the uterus (endometrium) starts growing outside the uterus (endometriosis).  The endometrium starts growing in the muscle of the uterus (adenomyosis).  The uterus falls down into the vagina (pelvic organ prolapse).  Noncancerous growths in the uterus (uterine fibroids) that cause symptoms.  Precancerous cells.  Cervical cancer or uterine cancer. TYPES OF HYSTERECTOMIES  Supracervical hysterectomy--In this type, the top part of the uterus is removed, but not the cervix.  Total hysterectomy--The uterus and cervix are removed.  Radical hysterectomy--The uterus, the cervix, and the fibrous tissue that holds the uterus in place in the pelvis (parametrium) are removed. WAYS A HYSTERECTOMY CAN BE PERFORMED  Abdominal hysterectomy--A large surgical cut (incision) is made in the abdomen. The uterus is removed through this incision.  Vaginal hysterectomy--An incision is made in the vagina. The uterus is removed through this incision. There are no abdominal incisions.  Conventional laparoscopic hysterectomy--Three or four small incisions are made in the abdomen. A thin, lighted tube with a camera (laparoscope) is inserted into one of the incisions. Other tools are put through the other incisions. The uterus is cut into small pieces. The small pieces are removed through the incisions, or they are removed through the vagina.  Laparoscopically assisted vaginal hysterectomy (LAVH)--Three or four small incisions are made in the abdomen. Part of the surgery is performed laparoscopically and part vaginally. The uterus is removed through the  vagina.  Robot-assisted laparoscopic hysterectomy--A laparoscope and other tools are inserted into 3 or 4 small incisions in the abdomen. A computer-controlled device is used to give the surgeon a 3D image and to help control the surgical instruments. This allows for more precise movements of surgical instruments. The uterus is cut into small pieces and removed through the incisions or removed through the vagina. RISKS AND COMPLICATIONS  Possible complications associated with this procedure include:  Bleeding and risk of blood transfusion. Tell your health care provider if you do not want to receive any blood products.  Blood clots in the legs or lung.  Infection.  Injury to surrounding organs.  Problems or side effects related to anesthesia.  Conversion to an abdominal hysterectomy from one of the other techniques. WHAT TO EXPECT AFTER A HYSTERECTOMY  You will be given pain medicine.  You will need to have someone with you for the first 3-5 days after you go home.  You will need to follow up with your surgeon in 2-4 weeks after surgery to evaluate your progress.  You may have early menopause symptoms such as hot flashes, night sweats, and insomnia.  If you had a hysterectomy for a problem that was not cancer or not a condition that could lead to cancer, then you no longer need Pap tests. However, even if you no longer need a Pap test, a regular exam is a good idea to make sure no other problems are starting.   This information is not intended to replace advice given to you by your health care provider. Make sure you discuss any questions you have with your health care provider.   Document Released: 12/10/2000 Document Revised: 04/06/2013 Document Reviewed: 02/21/2013 Elsevier Interactive Patient Education 2016 Elsevier Inc. Abdominal Hysterectomy, Care After Refer to this sheet in the next few weeks. These instructions provide you with information on caring for yourself after your  procedure. Your health care provider may also give you more specific instructions. Your treatment has been planned according to current medical practices, but problems sometimes occur. Call your health care provider if you have any problems or questions after your procedure.  WHAT TO EXPECT AFTER THE PROCEDURE After your procedure, it is typical to have the following:  Pain.  Feeling tired.  Poor appetite.  Less interest in sex. It takes 4-6 weeks to recover from this surgery.  HOME CARE INSTRUCTIONS   Take pain medicines only as directed by your health care provider. Do not take over-the-counter pain medicines without checking with your health care provider first.  Change your bandage as directed by your health care provider.  Return to your health care provider to have your sutures taken out.  Take showers instead of baths for 2-3 weeks. Ask your health care provider when it is safe to start showering.  Do not douche, use tampons, or  have sexual intercourse for at least 6 weeks or until your health care provider says you can.   Follow your health care provider's advice about exercise, lifting, driving, and general activities.  Get plenty of rest and sleep.   Do not lift anything heavier than a gallon of milk (about 10 lb [4.5 kg]) for the first month after surgery.  You can resume your normal diet if your health care provider says it is okay.   Do not drink alcohol until your health care provider says you can.   If you are constipated, ask your health care provider if you can take a mild laxative.  Eating foods high in fiber may also help with constipation. Eat plenty of raw fruits and vegetables, whole grains, and beans.  Drink enough fluids to keep your urine clear or pale yellow.   Try to have someone at home with you for the first 1-2 weeks to help around the house.  Keep all follow-up appointments. SEEK MEDICAL CARE IF:   You have chills or fever.  You  have swelling, redness, or pain in the area of your incision that is getting worse.   You have pus coming from the incision.   You notice a bad smell coming from the incision or bandage.   Your incision breaks open.   You feel dizzy or light-headed.   You have pain or bleeding when you urinate.   You have persistent diarrhea.   You have persistent nausea and vomiting.   You have abnormal vaginal discharge.   You have a rash.   You have any type of abnormal reaction or develop an allergy to your medicine.   Your pain medicine is not helping.  SEEK IMMEDIATE MEDICAL CARE IF:   You have a fever and your symptoms suddenly get worse.  You have severe abdominal pain.  You have chest pain.  You have shortness of breath.  You faint.  You have pain, swelling, or redness of your leg.  You have heavy vaginal bleeding with blood clots. MAKE SURE YOU:  Understand these instructions.  Will watch your condition.  Will get help right away if you are not doing well or get worse.   This information is not intended to replace advice given to you by your health care provider. Make sure you discuss any questions you have with your health care provider.   Document Released: 01/03/2005 Document Revised: 07/07/2014 Document Reviewed: 04/08/2013 Elsevier Interactive Patient Education 2016 Elsevier Inc. Abdominal Hysterectomy Abdominal hysterectomy is a surgical procedure to remove your womb (uterus). Your uterus is the muscular organ that contains a developing baby. This surgery is done for many reasons. You may need an abdominal hysterectomy if you have cancer, growths (tumors), long-term pain, or bleeding. You may also have this procedure if your uterus has slipped down into your vagina (uterine prolapse). Depending on why you need an abdominal hysterectomy, you may also have other reproductive organs removed. These could include the part of your vagina that connects with  your uterus (cervix), the organs that make eggs (ovaries), and the tubes that connect the ovaries to the uterus (fallopian tubes). LET [facility] CARE PROVIDER KNOW ABOUT:   Any allergies you have.  All medicines you are taking, including vitamins, herbs, eye drops, creams, and over-the-counter medicines.  Previous problems you or members of your family have had with the use of anesthetics.  Any blood disorders you have.  Previous surgeries you have had.  Medical conditions you  have. RISKS AND COMPLICATIONS Generally, this is a safe procedure. However, as with any procedure, problems can occur. Infection is the most common problem after an abdominal hysterectomy. Other possible problems include:  Bleeding.  Formation of blood clots that may break free and travel to your lungs.  Injury to other organs near your uterus.  Nerve injury causing nerve pain.  Decreased interest in sex or pain during sexual intercourse. BEFORE THE PROCEDURE  Abdominal hysterectomy is a major surgical procedure. It can affect the way you feel about yourself. Talk to your health care provider about the physical and emotional changes hysterectomy may cause.  You may need to have blood work and X-rays done before surgery.  Quit smoking if you smoke. Ask your health care provider for help if you are struggling to quit.  Stop taking medicines that thin your blood as directed by your health care provider.  You may be instructed to take antibiotic medicines or laxatives before surgery.  Do not eat or drink anything for 6-8 hours before surgery.  Take your regular medicines with a small sip of water.  Bathe or shower the night or morning before surgery. PROCEDURE  Abdominal hysterectomy is done in the operating room at the hospital.  In most cases, you will be given a medicine that makes you go to sleep (general anesthetic).  The surgeon will make a cut (incision) through the skin in your lower  belly.  The incision may be about 5-7 inches long. It may go side-to-side or up-and-down.  The surgeon will move aside the body tissue that covers your uterus. The surgeon will then carefully take out your uterus along with any of your other reproductive organs that need to be removed.  Bleeding will be controlled with clamps or sutures.  The surgeon will close your incision with sutures or metal clips. AFTER THE PROCEDURE  You will have some pain immediately after the procedure.  You will be given pain medicine in the recovery room.  You will be taken to your hospital room when you have recovered from the anesthesia.  You may need to stay in the hospital for 2-5 days.  You will be given instructions for recovery at home.   This information is not intended to replace advice given to you by your health care provider. Make sure you discuss any questions you have with your health care provider.   Document Released: 06/21/2013 Document Reviewed: 06/21/2013 Elsevier Interactive Patient Education [employer] Exploratory Laparotomy, Adult, Care After Refer to this sheet in the next few weeks. These instructions provide you with information about caring for yourself after your procedure. Your health care provider may also give you more specific instructions. Your treatment has been planned according to current medical practices, but problems sometimes occur. Call your health care provider if you have any problems or questions after your procedure. WHAT TO EXPECT AFTER THE PROCEDURE After your procedure, it is typical to have:  Abdominal soreness.  Fatigue.  A sore throat from tubes in your throat.  A lack of appetite. HOME CARE INSTRUCTIONS Medicines  Take medicines only as directed by your health care provider.  Do not drive or operate heavy machinery while taking pain medicine. Incision Care  There are many different ways to close and cover an incision, including  stitches (sutures), skin glue, and adhesive strips. Follow your health care provider's instructions about:  Incision care.  Bandage (dressing) changes and removal.  Incision closure removal.  Do not take  showers or baths until your health care provider says that you can.  Check your incision area daily for signs of infection. Watch for:  Redness.  Tenderness.  Swelling.  Drainage. Activities  Do not lift anything that is heavier than 10 pounds (4.5 kg) until your health care provider says that it is safe.  Try to walk a little bit each day if your health care provider says that it is okay.  Ask your health care provider when you can start to do your usual activities again, such as driving, going back to work, and having sex. Eating and Drinking  You may eat what you usually eat. Include lots of whole grains, fruits, and vegetables in your diet. This will help to prevent constipation.  Drink enough fluid to keep your urine clear or pale yellow. General Instructions  Keep all follow-up visits as directed by your health care provider. This is important. SEEK MEDICAL CARE IF:   You have a fever.  You have chills.  Your pain medicine is not helping.  You have constipation or diarrhea.  You have nausea or vomiting.  You have drainage, redness, swelling, or pain at your incision site. SEEK IMMEDIATE MEDICAL CARE IF:  Your pain is getting worse.  It has been more than 3 days since you been able to have a bowel movement.  You have ongoing (persistent) vomiting.  The edges of your incision open up.  You have warmth, tenderness, and swelling in your calf.  You have trouble breathing.  You have chest pain.   This information is not intended to replace advice given to you by your health care provider. Make sure you discuss any questions you have with your health care provider.   Document Released: 01/29/2004 Document Revised: 07/07/2014 Document Reviewed:  02/01/2014 Elsevier Interactive Patient Education 2016 [employer]. Exploratory Laparotomy, Adult Exploratory laparotomy is a surgical procedure to examine the organs inside your belly (abdomen). Another name for this is abdominal exploration. You may have this procedure if you have abdominal pain, trauma, bleeding, infection, or obstruction. The procedure may be done if your health care provider cannot make a diagnosis from only an exam and testing. Exploratory laparotomy may be a planned procedure or an emergency procedure. You may have surgical treatment as part of the laparotomy, or you may have additional treatment after your laparotomy. This will depend on what your surgeon finds during the procedure. LET [facility] CARE PROVIDER KNOW ABOUT:  Any allergies you have.  All medicines you are taking, including vitamins, herbs, eye drops, creams, and over-the-counter medicines.  Previous problems you or members of your family have had with the use of anesthetics.  Any blood disorders you have.  Previous surgeries you have had.  Medical conditions you have. RISKS AND COMPLICATIONS Generally, this is a safe procedure. However, problems can occur and include:  Bleeding.  Infection.  A blood clot that forms in your leg and travels to your lungs.  Damage to organs inside your abdomen.  Scar tissue that blocks your digestive tract. BEFORE THE PROCEDURE  Ask your health care provider about:  Changing or stopping your regular medicines. This is especially important if you are taking diabetes medicines or blood thinners.  Taking medicines such as aspirin and ibuprofen. These medicines can thin your blood. Do not take these medicines before your procedure if your health care provider instructs you not to.  Do not eat or drink anything after midnight on the night before the procedure or  as directed by your health care provider.  You may be given instructions for clearing out your  bowel before surgery (bowel prep). If you are already in the hospital, the bowel prep may be done there. PROCEDURE  An IV tube may be inserted into a vein. You may receive fluids and medicine through the IV tube. This may include antibiotic medicine to treat or prevent infection.  You will be given a medicine that makes you go to sleep (general anesthetic).  You may have a tube placed through your nose and into your stomach (nasogastric tube) to drain your stomach fluids.  You may have a tube placed into your bladder (urinary catheter) to drain urine.  Your abdomen will be cleaned with a germ-killing solution (antiseptic).  The surgeon will make a surgical cut (incision) in your abdomen. This is usually an up-and-down incision in the midsection of your abdomen. The incision will go through the inside lining of your abdomen (peritoneum).  Your surgeon will spread the incision wide enough to examine the inside of your abdomen.  The rest of the procedure will depend on what the surgeon finds:  The surgeon will check all organs in your abdomen for damage or obstruction. Repairs will be made when possible.  If there is blood in the abdomen, the surgeon will look for the source of the bleeding in order to stop it.  If there is yellowish-white fluid (pus) or gastric fluids in your abdomen, the surgeon will check for an infection or a hole (perforation) in your digestive tract.  If the surgeon finds infection, a drain may be placed to empty fluid that can build up in your abdomen after surgery.  If there is a growth (tumor) inside your abdomen, the surgeon may remove a piece of the growth (biopsy) to examine it under a microscope.  When all procedures are complete, the surgeon will close your abdomen with layers of stitches (sutures).  The incision through the skin of your abdomen will be closed with sutures or staples. AFTER THE PROCEDURE  Your blood pressure, heart rate, breathing rate,  and blood oxygen level will be monitored often until the medicines you were given have worn off.  You will continue to receive fluids and nutrition through your IV tube. This will stop when you can eat and drink on your own.  You may also get antibiotic medicine and pain medicine through your IV tube.  Your nasogastric tube may be removed when you start to pass gas.  Your urinary catheter may be removed when the anesthetic wears off.   This information is not intended to replace advice given to you by your health care provider. Make sure you discuss any questions you have with your health care provider.   Document Released: 03/11/2001 Document Revised: 07/07/2014 Document Reviewed: 02/01/2014 Elsevier Interactive Patient Education 2016 Elsevier Inc. PATIENT INSTRUCTIONS POST-ANESTHESIA  IMMEDIATELY FOLLOWING SURGERY:  Do not drive or operate machinery for the first twenty four hours after surgery.  Do not make any important decisions for twenty four hours after surgery or while taking narcotic pain medications or sedatives.  If you develop intractable nausea and vomiting or a severe headache please notify your doctor immediately.  FOLLOW-UP:  Please make an appointment with your surgeon as instructed. You do not need to follow up with anesthesia unless specifically instructed to do so.  WOUND CARE INSTRUCTIONS (if applicable):  Keep a dry clean dressing on the anesthesia/puncture wound site if there is drainage.  Once  the wound has quit draining you may leave it open to air.  Generally you should leave the bandage intact for twenty four hours unless there is drainage.  If the epidural site drains for more than 36-48 hours please call the anesthesia department.  QUESTIONS?:  Please feel free to call your physician or the hospital operator if you have any questions, and they will be happy to assist you.

## 2015-07-04 ENCOUNTER — Inpatient Hospital Stay (HOSPITAL_COMMUNITY)
Admission: AD | Admit: 2015-07-04 | Discharge: 2015-07-09 | DRG: 742 | Disposition: A | Discharge status: Home / Self Care | Payer: BLUE CROSS/BLUE SHIELD | Source: Ambulatory Visit | Attending: Obstetrics & Gynecology | Admitting: Obstetrics & Gynecology

## 2015-07-04 ENCOUNTER — Ambulatory Visit (HOSPITAL_COMMUNITY): Payer: BLUE CROSS/BLUE SHIELD | Admitting: Anesthesiology

## 2015-07-04 ENCOUNTER — Encounter (HOSPITAL_COMMUNITY): Payer: Self-pay | Admitting: *Deleted

## 2015-07-04 ENCOUNTER — Encounter (HOSPITAL_COMMUNITY)
Admission: AD | Disposition: A | Discharge status: Home / Self Care | Payer: Self-pay | Source: Ambulatory Visit | Attending: Obstetrics & Gynecology

## 2015-07-04 DIAGNOSIS — A047 Enterocolitis due to Clostridium difficile: Secondary | ICD-10-CM | POA: Diagnosis present

## 2015-07-04 DIAGNOSIS — Z87891 Personal history of nicotine dependence: Secondary | ICD-10-CM

## 2015-07-04 DIAGNOSIS — Y95 Nosocomial condition: Secondary | ICD-10-CM | POA: Diagnosis present

## 2015-07-04 DIAGNOSIS — R102 Pelvic and perineal pain: Secondary | ICD-10-CM

## 2015-07-04 DIAGNOSIS — N801 Endometriosis of ovary: Secondary | ICD-10-CM

## 2015-07-04 DIAGNOSIS — Z9889 Other specified postprocedural states: Secondary | ICD-10-CM

## 2015-07-04 DIAGNOSIS — J189 Pneumonia, unspecified organism: Secondary | ICD-10-CM | POA: Diagnosis not present

## 2015-07-04 DIAGNOSIS — Z8742 Personal history of other diseases of the female genital tract: Secondary | ICD-10-CM

## 2015-07-04 DIAGNOSIS — D649 Anemia, unspecified: Secondary | ICD-10-CM | POA: Diagnosis present

## 2015-07-04 HISTORY — PX: LAPAROTOMY: SHX154

## 2015-07-04 SURGERY — LAPAROTOMY, EXPLORATORY
Anesthesia: General

## 2015-07-04 MED ORDER — MIDAZOLAM HCL 2 MG/2ML IJ SOLN
1.0000 mg | INTRAMUSCULAR | Status: DC | PRN
  Administered 2015-07-04: 2 mg via INTRAVENOUS

## 2015-07-04 MED ORDER — CEFAZOLIN SODIUM-DEXTROSE 2-3 GM-% IV SOLR
2.0000 g | INTRAVENOUS | Status: AC
Start: 2015-07-04 — End: 2015-07-04
  Administered 2015-07-04: 2 g via INTRAVENOUS
  Filled 2015-07-04: qty 50

## 2015-07-04 MED ORDER — ADULT MULTIVITAMIN W/MINERALS CH
1.0000 | ORAL_TABLET | Freq: Every day | ORAL | Status: DC
  Administered 2015-07-04 – 2015-07-08 (×5): 1 via ORAL
  Filled 2015-07-04 (×5): qty 1

## 2015-07-04 MED ORDER — ONDANSETRON HCL 4 MG/2ML IJ SOLN
INTRAMUSCULAR | Status: AC
  Filled 2015-07-04: qty 2

## 2015-07-04 MED ORDER — OXYCODONE-ACETAMINOPHEN 5-325 MG PO TABS
1.0000 | ORAL_TABLET | ORAL | Status: DC | PRN
  Administered 2015-07-04 – 2015-07-09 (×14): 2 via ORAL
  Filled 2015-07-04 (×15): qty 2

## 2015-07-04 MED ORDER — KETOROLAC TROMETHAMINE 30 MG/ML IJ SOLN
30.0000 mg | Freq: Once | INTRAMUSCULAR | Status: AC
  Administered 2015-07-04: 30 mg via INTRAVENOUS

## 2015-07-04 MED ORDER — HYDROMORPHONE HCL 1 MG/ML IJ SOLN
1.0000 mg | INTRAMUSCULAR | Status: DC | PRN
  Administered 2015-07-04 (×2): 2 mg via INTRAVENOUS
  Administered 2015-07-04 (×2): 1 mg via INTRAVENOUS
  Administered 2015-07-04 – 2015-07-05 (×2): 2 mg via INTRAVENOUS
  Filled 2015-07-04: qty 1
  Filled 2015-07-04 (×2): qty 2
  Filled 2015-07-04: qty 1
  Filled 2015-07-04 (×2): qty 2

## 2015-07-04 MED ORDER — DOCUSATE SODIUM 100 MG PO CAPS
100.0000 mg | ORAL_CAPSULE | Freq: Two times a day (BID) | ORAL | Status: DC
  Administered 2015-07-04 – 2015-07-09 (×6): 100 mg via ORAL
  Filled 2015-07-04 (×9): qty 1

## 2015-07-04 MED ORDER — KETOROLAC TROMETHAMINE 30 MG/ML IJ SOLN
INTRAMUSCULAR | Status: AC
  Filled 2015-07-04: qty 1

## 2015-07-04 MED ORDER — MIDAZOLAM HCL 2 MG/2ML IJ SOLN
INTRAMUSCULAR | Status: AC
  Filled 2015-07-04: qty 2

## 2015-07-04 MED ORDER — BUPIVACAINE LIPOSOME 1.3 % IJ SUSP
INTRAMUSCULAR | Status: DC | PRN
  Administered 2015-07-04: 20 mL

## 2015-07-04 MED ORDER — EPHEDRINE SULFATE 50 MG/ML IJ SOLN
INTRAMUSCULAR | Status: AC
  Filled 2015-07-04: qty 1

## 2015-07-04 MED ORDER — THROMBIN 5000 UNITS EX SOLR
CUTANEOUS | Status: AC
  Filled 2015-07-04: qty 5000

## 2015-07-04 MED ORDER — 0.9 % SODIUM CHLORIDE (POUR BTL) OPTIME
TOPICAL | Status: DC | PRN
  Administered 2015-07-04: 2000 mL

## 2015-07-04 MED ORDER — BISACODYL 10 MG RE SUPP: 10.0000 mg | Freq: Every day | RECTAL | Status: DC | PRN

## 2015-07-04 MED ORDER — ZOLPIDEM TARTRATE 5 MG PO TABS
5.0000 mg | ORAL_TABLET | Freq: Every evening | ORAL | Status: DC | PRN
  Administered 2015-07-04: 5 mg via ORAL
  Filled 2015-07-04: qty 1

## 2015-07-04 MED ORDER — ROCURONIUM BROMIDE 100 MG/10ML IV SOLN
INTRAVENOUS | Status: DC | PRN
  Administered 2015-07-04: 10 mg via INTRAVENOUS
  Administered 2015-07-04: 45 mg via INTRAVENOUS
  Administered 2015-07-04: 5 mg via INTRAVENOUS

## 2015-07-04 MED ORDER — LACTATED RINGERS IV SOLN
INTRAVENOUS | Status: DC
Start: 2015-07-04 — End: 2015-07-04
  Administered 2015-07-04 (×3): via INTRAVENOUS

## 2015-07-04 MED ORDER — SODIUM CHLORIDE 0.9 % IJ SOLN
INTRAMUSCULAR | Status: AC
  Filled 2015-07-04: qty 10

## 2015-07-04 MED ORDER — LIDOCAINE HCL 1 % IJ SOLN
INTRAMUSCULAR | Status: DC | PRN
  Administered 2015-07-04: 30 mg via INTRADERMAL

## 2015-07-04 MED ORDER — NEOSTIGMINE METHYLSULFATE 10 MG/10ML IV SOLN
INTRAVENOUS | Status: AC
  Filled 2015-07-04: qty 1

## 2015-07-04 MED ORDER — DEXAMETHASONE SODIUM PHOSPHATE 4 MG/ML IJ SOLN
INTRAMUSCULAR | Status: AC
  Filled 2015-07-04: qty 1

## 2015-07-04 MED ORDER — ROCURONIUM BROMIDE 50 MG/5ML IV SOLN
INTRAVENOUS | Status: AC
  Filled 2015-07-04: qty 1

## 2015-07-04 MED ORDER — ONDANSETRON HCL 4 MG/2ML IJ SOLN: 4.0000 mg | Freq: Once | INTRAMUSCULAR | Status: DC | PRN

## 2015-07-04 MED ORDER — SENNOSIDES-DOCUSATE SODIUM 8.6-50 MG PO TABS: 1.0000 | ORAL_TABLET | Freq: Every evening | ORAL | Status: DC | PRN

## 2015-07-04 MED ORDER — ALUM & MAG HYDROXIDE-SIMETH 200-200-20 MG/5ML PO SUSP: 30.0000 mL | ORAL | Status: DC | PRN

## 2015-07-04 MED ORDER — FENTANYL CITRATE (PF) 250 MCG/5ML IJ SOLN
INTRAMUSCULAR | Status: AC
  Filled 2015-07-04: qty 10

## 2015-07-04 MED ORDER — GLYCOPYRROLATE 0.2 MG/ML IJ SOLN
INTRAMUSCULAR | Status: DC | PRN
  Administered 2015-07-04 (×2): 0.3 mg via INTRAVENOUS

## 2015-07-04 MED ORDER — MIDAZOLAM HCL 5 MG/5ML IJ SOLN
INTRAMUSCULAR | Status: DC | PRN
  Administered 2015-07-04: 2 mg via INTRAVENOUS

## 2015-07-04 MED ORDER — THROMBIN 5000 UNITS EX SOLR
CUTANEOUS | Status: DC | PRN
  Administered 2015-07-04: 5 mL via TOPICAL

## 2015-07-04 MED ORDER — DEXAMETHASONE SODIUM PHOSPHATE 4 MG/ML IJ SOLN
4.0000 mg | Freq: Once | INTRAMUSCULAR | Status: AC
  Administered 2015-07-04: 4 mg via INTRAVENOUS

## 2015-07-04 MED ORDER — FENTANYL CITRATE (PF) 100 MCG/2ML IJ SOLN
25.0000 ug | INTRAMUSCULAR | Status: DC | PRN
  Administered 2015-07-04: 25 ug via INTRAVENOUS
  Administered 2015-07-04: 50 ug via INTRAVENOUS
  Administered 2015-07-04 (×2): 25 ug via INTRAVENOUS
  Administered 2015-07-04: 50 ug via INTRAVENOUS
  Filled 2015-07-04 (×2): qty 2

## 2015-07-04 MED ORDER — LORATADINE 10 MG PO TABS
10.0000 mg | ORAL_TABLET | Freq: Every day | ORAL | Status: DC
  Administered 2015-07-04 – 2015-07-08 (×5): 10 mg via ORAL
  Filled 2015-07-04 (×5): qty 1

## 2015-07-04 MED ORDER — SODIUM CHLORIDE 0.9 % IV SOLN: 8.0000 mg | Freq: Four times a day (QID) | INTRAVENOUS | Status: DC | PRN

## 2015-07-04 MED ORDER — PROPOFOL 10 MG/ML IV BOLUS
INTRAVENOUS | Status: DC | PRN
  Administered 2015-07-04: 150 mg via INTRAVENOUS

## 2015-07-04 MED ORDER — ONDANSETRON HCL 4 MG PO TABS
8.0000 mg | ORAL_TABLET | Freq: Four times a day (QID) | ORAL | Status: DC | PRN
  Administered 2015-07-08: 8 mg via ORAL
  Filled 2015-07-04: qty 2

## 2015-07-04 MED ORDER — BUPIVACAINE LIPOSOME 1.3 % IJ SUSP
20.0000 mL | Freq: Once | INTRAMUSCULAR | Status: DC
  Filled 2015-07-04: qty 20

## 2015-07-04 MED ORDER — DIPHENHYDRAMINE HCL 25 MG PO CAPS: 50.0000 mg | ORAL_CAPSULE | Freq: Four times a day (QID) | ORAL | Status: DC | PRN

## 2015-07-04 MED ORDER — NEOSTIGMINE METHYLSULFATE 10 MG/10ML IV SOLN
INTRAVENOUS | Status: DC | PRN
  Administered 2015-07-04: 2 mg via INTRAVENOUS
  Administered 2015-07-04: 1 mg via INTRAVENOUS

## 2015-07-04 MED ORDER — OMEGA-3-ACID ETHYL ESTERS 1 G PO CAPS
2.0000 g | ORAL_CAPSULE | Freq: Every day | ORAL | Status: DC
  Administered 2015-07-04 – 2015-07-08 (×5): 2 g via ORAL
  Filled 2015-07-04 (×5): qty 2

## 2015-07-04 MED ORDER — PROPOFOL 10 MG/ML IV BOLUS
INTRAVENOUS | Status: AC
Start: 2015-07-04 — End: 2015-07-04
  Filled 2015-07-04: qty 20

## 2015-07-04 MED ORDER — BUPIVACAINE LIPOSOME 1.3 % IJ SUSP
INTRAMUSCULAR | Status: AC
  Filled 2015-07-04: qty 20

## 2015-07-04 MED ORDER — LIDOCAINE HCL (PF) 1 % IJ SOLN
INTRAMUSCULAR | Status: AC
  Filled 2015-07-04: qty 5

## 2015-07-04 MED ORDER — ONDANSETRON HCL 4 MG/2ML IJ SOLN
4.0000 mg | Freq: Once | INTRAMUSCULAR | Status: AC
  Administered 2015-07-04: 4 mg via INTRAVENOUS

## 2015-07-04 MED ORDER — KCL IN DEXTROSE-NACL 20-5-0.45 MEQ/L-%-% IV SOLN
INTRAVENOUS | Status: DC
  Administered 2015-07-04 – 2015-07-05 (×3): via INTRAVENOUS

## 2015-07-04 MED ORDER — ESCITALOPRAM OXALATE 10 MG PO TABS
20.0000 mg | ORAL_TABLET | Freq: Every day | ORAL | Status: DC
  Administered 2015-07-04 – 2015-07-08 (×5): 20 mg via ORAL
  Filled 2015-07-04 (×5): qty 2

## 2015-07-04 MED ORDER — FENTANYL CITRATE (PF) 100 MCG/2ML IJ SOLN
INTRAMUSCULAR | Status: DC | PRN
  Administered 2015-07-04: 50 ug via INTRAVENOUS
  Administered 2015-07-04: 100 ug via INTRAVENOUS
  Administered 2015-07-04: 50 ug via INTRAVENOUS
  Administered 2015-07-04 (×2): 100 ug via INTRAVENOUS

## 2015-07-04 MED ORDER — GLYCOPYRROLATE 0.2 MG/ML IJ SOLN
INTRAMUSCULAR | Status: AC
  Filled 2015-07-04: qty 3

## 2015-07-04 SURGICAL SUPPLY — 48 items
BAG HAMPER (MISCELLANEOUS) ×4 IMPLANT
CELLS DAT CNTRL 66122 CELL SVR (MISCELLANEOUS) ×2 IMPLANT
CLOTH BEACON ORANGE TIMEOUT ST (SAFETY) ×4 IMPLANT
COVER LIGHT HANDLE STERIS (MISCELLANEOUS) ×8 IMPLANT
DRAPE WARM FLUID 44X44 (DRAPE) ×4 IMPLANT
ELECT REM PT RETURN 9FT ADLT (ELECTROSURGICAL) ×4
ELECTRODE REM PT RTRN 9FT ADLT (ELECTROSURGICAL) ×2 IMPLANT
GLOVE BIOGEL PI IND STRL 6.5 (GLOVE) ×2 IMPLANT
GLOVE BIOGEL PI IND STRL 7.0 (GLOVE) ×6 IMPLANT
GLOVE BIOGEL PI IND STRL 8 (GLOVE) ×2 IMPLANT
GLOVE BIOGEL PI IND STRL 9 (GLOVE) ×2 IMPLANT
GLOVE BIOGEL PI INDICATOR 6.5 (GLOVE) ×2
GLOVE BIOGEL PI INDICATOR 7.0 (GLOVE) ×6
GLOVE BIOGEL PI INDICATOR 8 (GLOVE) ×2
GLOVE BIOGEL PI INDICATOR 9 (GLOVE) ×2
GLOVE ECLIPSE 6.5 STRL STRAW (GLOVE) ×4 IMPLANT
GLOVE ECLIPSE 8.0 STRL XLNG CF (GLOVE) ×4 IMPLANT
GLOVE ECLIPSE 9.0 STRL (GLOVE) ×4 IMPLANT
GOWN SPEC L3 XXLG W/TWL (GOWN DISPOSABLE) ×4 IMPLANT
GOWN STRL REUS W/TWL LRG LVL3 (GOWN DISPOSABLE) ×8 IMPLANT
GOWN STRL REUS W/TWL XL LVL3 (GOWN DISPOSABLE) ×4 IMPLANT
INST SET MAJOR GENERAL (KITS) ×4 IMPLANT
KIT ROOM TURNOVER APOR (KITS) ×4 IMPLANT
LIQUID BAND (GAUZE/BANDAGES/DRESSINGS) ×4 IMPLANT
MANIFOLD NEPTUNE II (INSTRUMENTS) ×4 IMPLANT
NEEDLE HYPO 21X1.5 SAFETY (NEEDLE) ×4 IMPLANT
NS IRRIG 1000ML POUR BTL (IV SOLUTION) ×8 IMPLANT
PACK ABDOMINAL MAJOR (CUSTOM PROCEDURE TRAY) ×4 IMPLANT
PAD ABD 8X10 STRL (GAUZE/BANDAGES/DRESSINGS) ×8 IMPLANT
PAD ARMBOARD 7.5X6 YLW CONV (MISCELLANEOUS) ×4 IMPLANT
RTRCTR WOUND ALEXIS 18CM MED (MISCELLANEOUS) ×4
SET BASIN LINEN APH (SET/KITS/TRAYS/PACK) ×4 IMPLANT
SPONGE GAUZE 4X4 12PLY (GAUZE/BANDAGES/DRESSINGS) ×4 IMPLANT
SPONGE LAP 18X18 X RAY DECT (DISPOSABLE) ×4 IMPLANT
SPONGE SURGIFOAM ABS GEL 100 (HEMOSTASIS) ×4 IMPLANT
SUT CHROMIC 0 CT 1 (SUTURE) ×4 IMPLANT
SUT MNCRL+ AB 3-0 CT1 36 (SUTURE) ×2 IMPLANT
SUT MON AB 3-0 SH 27 (SUTURE) ×28 IMPLANT
SUT MONOCRYL AB 3-0 CT1 36IN (SUTURE) ×2
SUT VIC AB 0 CT1 27 (SUTURE) ×4
SUT VIC AB 0 CT1 27XCR 8 STRN (SUTURE) ×4 IMPLANT
SUT VIC AB 0 CTX 36 (SUTURE) ×2
SUT VIC AB 0 CTX36XBRD ANTBCTR (SUTURE) ×2 IMPLANT
SUT VICRYL 3 0 (SUTURE) ×8 IMPLANT
SYR 20CC LL (SYRINGE) ×4 IMPLANT
TAPE CLOTH SURG 4X10 WHT LF (GAUZE/BANDAGES/DRESSINGS) ×4 IMPLANT
TOWEL BLUE STERILE X RAY DET (MISCELLANEOUS) ×4 IMPLANT
TRAY FOLEY CATH SILVER 16FR (SET/KITS/TRAYS/PACK) ×4 IMPLANT

## 2015-07-04 NOTE — Transfer of Care (Signed)
Immediate Anesthesia Transfer of Care Note  Patient: Selena Mann  Procedure(s) Performed: Procedure(s): EXPLORATORY LAPAROTOMY, REMOVAL OF BILATERAL OVARIAN ENDOMETRIOMAS (N/A)  Patient Location: PACU  Anesthesia Type:General  Level of Consciousness: awake and patient cooperative  Airway & Oxygen Therapy: Patient Spontanous Breathing and Patient connected to face mask oxygen  Post-op Assessment: Report given to RN, Post -op Vital signs reviewed and stable and Patient moving all extremities  Post vital signs: Reviewed and stable  Last Vitals:  Filed Vitals:   07/04/15 0730 07/04/15 0735  BP: 106/68 103/66  Pulse:    Temp:    Resp: 17 20    Complications: No apparent anesthesia complications

## 2015-07-04 NOTE — Anesthesia Procedure Notes (Signed)
Procedure Name: Intubation Date/Time: 07/04/2015 7:52 AM Performed by: Despina [name], Selena Mann Pre-anesthesia Checklist: Patient identified, Emergency Drugs available, Suction available and Patient being monitored Patient Re-evaluated:Patient Re-evaluated prior to inductionOxygen Delivery Method: Circle system utilized Preoxygenation: Pre-oxygenation with 100% oxygen Intubation Type: IV induction Ventilation: Mask ventilation without difficulty and Oral airway inserted - appropriate to patient size Laryngoscope Size: Mac and 3 Grade View: Grade I Tube type: Oral Tube size: 7.0 mm Number of attempts: 1 Airway Equipment and Method: Stylet and Oral airway Placement Confirmation: ETT inserted through vocal cords under direct vision,  positive ETCO2 and breath sounds checked- equal and bilateral Secured at: 22 cm Tube secured with: Tape Dental Injury: Teeth and Oropharynx as per pre-operative assessment

## 2015-07-04 NOTE — Op Note (Signed)
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  Anesthesia: Gen. Endotracheal  Findings: Again to re-The patient was [facility] last [facility] initially felt to be pelvic inflammatory disease/bilateral tubo-ovarian abscesses.  However her clinical picture was not consistent with that and in reviewing her CT scan I felt like it was most consistent with bilateral endometriomas and sonogram confirmed.  As a result she was discharged time because of the holidays could not really had any time to do her surgery during that hospitalization and scheduled her for today.  Intraoperatively there were no surprises.  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  The ovaries were freed up manually The ovaries were exteriorized and bilateral ovarian cystectomies were performed to remove the endometriomas bilaterally. The endometriomas were removed in  total peeling the endometrioma off the ovary and excising the extra ovarian cortex.  Both ovaries were then closed with interrupted Vicryl sutures for hemostasis.  A good number of Monocryl sutures were placed the peritoneum because of the dissection of the highly vascular the posterior uterine wall the pelvic sidewall and the sigmoid colon.  There was no injury to the sigmoid.  Pressure was applied and there was still some just peritoneal oozing no particular vascular embarrassment.  As a result I placed Gelfoam with thrombin in those areas and that provided adequate hemostasis.  The Perlie GoldRussell some peritoneum reapproximated the fascia was closed using 0 Vicryl running and the skin was closed using 3-0 Vicryl on a Keith needle in subcuticular fashion Local band was placed as a bandage X per L was injected in the subcutaneous tissue for postoperative pain management The patient received Ancef and Toradol prophylactically Blood loss for the procedure was 350 cc She was awakened from anesthesia taken covering good stable condition all counts are correct 3  Lazaro ArmsEURE,LUTHER H, MD 07/04/2015 10:25 AM

## 2015-07-04 NOTE — H&P (Signed)
Preoperative History and Physical  Selena Mann is a 30 y.o. G1P0010 with Patient's last menstrual period was 06/09/2015. admitted for a  . Exploratory laparotomy with removal of bilateral ovarian endometriomas, indicated procedures, which may include abdominal hysterectomy with BSO  Pt was admitted to [facility] last week for 3 days for pelvic pain and imaging revealed bilateral adnexal masses TOA vs endometriomas.  She was begun on appropriate antibiotics but clinically more consistent with endometriomas and review of imaging also confirmed this decision.  Stopped antibiotics and temp remained normal as did clinical condition.   Because of the size of her ovarian masses lupron would not be effective so will perform surgery and then do post operative lupron for 6 months.  Pt and partner are aware of the decision algorithm intra operatively.    PMH:    Past Medical History  Diagnosis Date  . Endometriosis   . Depression     PSH:     Past Surgical History  Procedure Laterality Date  . Tonsillectomy      POb/GynH:      OB History    Gravida Para Term Preterm AB TAB SAB Ectopic Multiple Living   1 0 0 0 1 0 1 0 0 0       SH:   Social History  Substance Use Topics  . Smoking status: Former Games developermoker  . Smokeless tobacco: None  . Alcohol Use: Yes     Comment: occasional    FH:   History reviewed. No pertinent family history.   Allergies: No Known Allergies  Medications:       Current facility-administered medications:  .  bupivacaine liposome (EXPAREL) 1.3 % injection 266 mg, 20 mL, Infiltration, Once, Lazaro ArmsLuther H Teshawn Moan, MD .  ceFAZolin (ANCEF) IVPB 2 g/50 mL premix, 2 g, Intravenous, On Call to OR, Lazaro ArmsLuther H Brynnan Rodenbaugh, MD .  lactated ringers infusion, , Intravenous, Continuous, Laurene FootmanLuis Gonzalez, MD, Last Rate: 75 mL/hr at 07/04/15 0716 .  midazolam (VERSED) injection 1-2 mg, 1-2 mg, Intravenous, Q5 min PRN, Laurene FootmanLuis Gonzalez, MD, 2 mg at 07/04/15 0720  Review of Systems:   Review  of Systems  Constitutional: Negative for fever, chills, weight loss, malaise/fatigue and diaphoresis.  HENT: Negative for hearing loss, ear pain, nosebleeds, congestion, sore throat, neck pain, tinnitus and ear discharge.   Eyes: Negative for blurred vision, double vision, photophobia, pain, discharge and redness.  Respiratory: Negative for cough, hemoptysis, sputum production, shortness of breath, wheezing and stridor.   Cardiovascular: Negative for chest pain, palpitations, orthopnea, claudication, leg swelling and PND.  Gastrointestinal: Positive for abdominal pain. Negative for heartburn, nausea, vomiting, diarrhea, constipation, blood in stool and melena.  Genitourinary: Negative for dysuria, urgency, frequency, hematuria and flank pain.  Musculoskeletal: Negative for myalgias, back pain, joint pain and falls.  Skin: Negative for itching and rash.  Neurological: Negative for dizziness, tingling, tremors, sensory change, speech change, focal weakness, seizures, loss of consciousness, weakness and headaches.  Endo/Heme/Allergies: Negative for environmental allergies and polydipsia. Does not bruise/bleed easily.  Psychiatric/Behavioral: Negative for depression, suicidal ideas, hallucinations, memory loss and substance abuse. The patient is not nervous/anxious and does not have insomnia.      PHYSICAL EXAM:  Blood pressure 110/65, pulse 68, temperature 97.5 F (36.4 C), temperature source Oral, resp. rate 19, last menstrual period 06/09/2015, SpO2 95 %.    Vitals reviewed. Constitutional: She is oriented to person, place, and time. She appears well-developed and well-nourished.  HENT:  Head: Normocephalic and atraumatic.  Right Ear:  External ear normal.  Left Ear: External ear normal.  Nose: Nose normal.  Mouth/Throat: Oropharynx is clear and moist.  Eyes: Conjunctivae and EOM are normal. Pupils are equal, round, and reactive to light. Right eye exhibits no discharge. Left eye exhibits  no discharge. No scleral icterus.  Neck: Normal range of motion. Neck supple. No tracheal deviation present. No thyromegaly present.  Cardiovascular: Normal rate, regular rhythm, normal heart sounds and intact distal pulses.  Exam reveals no gallop and no friction rub.   No murmur heard. Respiratory: Effort normal and breath sounds normal. No respiratory distress. She has no wheezes. She has no rales. She exhibits no tenderness.  GI: Soft. Bowel sounds are normal. Moderate to severe abdominal pain +/- rebound all lower areas Genitourinary:       Vulva is normal without lesions Vagina is pink moist without discharge Cervix no lesions Uterus is per sonogram NSSC Adnexa is per sonogram Musculoskeletal: Normal range of motion. She exhibits no edema and no tenderness.  Neurological: She is alert and oriented to person, place, and time. She has normal reflexes. She displays normal reflexes. No cranial nerve deficit. She exhibits normal muscle tone. Coordination normal.  Skin: Skin is warm and dry. No rash noted. No erythema. No pallor.  Psychiatric: She has a normal mood and affect. Her behavior is normal. Judgment and thought content normal.    Labs: Results for orders placed or performed during the hospital encounter of 07/03/15 (from the past 336 hour(s))  CBC   Collection Time: 07/03/15 11:50 AM  Result Value Ref Range   WBC 9.9 4.0 - 10.5 K/uL   RBC 4.62 3.87 - 5.11 MIL/uL   Hemoglobin 13.2 12.0 - 15.0 g/dL   HCT 16.1 09.6 - 04.5 %   MCV 86.6 78.0 - 100.0 fL   MCH 28.6 26.0 - 34.0 pg   MCHC 33.0 30.0 - 36.0 g/dL   RDW 40.9 81.1 - 91.4 %   Platelets 338 150 - 400 K/uL  Comprehensive metabolic panel   Collection Time: 07/03/15 11:50 AM  Result Value Ref Range   Sodium 139 135 - 145 mmol/L   Potassium 4.3 3.5 - 5.1 mmol/L   Chloride 111 101 - 111 mmol/L   CO2 20 (L) 22 - 32 mmol/L   Glucose, Bld 92 65 - 99 mg/dL   BUN 8 6 - 20 mg/dL   Creatinine, Ser 7.82 0.44 - 1.00 mg/dL    Calcium 9.3 8.9 - 95.6 mg/dL   Total Protein 6.5 6.5 - 8.1 g/dL   Albumin 3.5 3.5 - 5.0 g/dL   AST 25 15 - 41 U/L   ALT 31 14 - 54 U/L   Alkaline Phosphatase 109 38 - 126 U/L   Total Bilirubin 0.3 0.3 - 1.2 mg/dL   GFR calc non Af Amer >60 >60 mL/min   GFR calc Af Amer >60 >60 mL/min   Anion gap 8 5 - 15  hCG, quantitative, pregnancy   Collection Time: 07/03/15 11:50 AM  Result Value Ref Range   hCG, Beta Chain, Quant, S <1 <5 mIU/mL  Type and screen   Collection Time: 07/03/15 11:50 AM  Result Value Ref Range   ABO/RH(D) O POS    Antibody Screen NEG    Sample Expiration 07/17/2015   Results for orders placed or performed during the hospital encounter of 06/28/15 (from the past 336 hour(s))  GC/Chlamydia probe amp ([place])not at [facility]   Collection Time: 06/28/15 12:00 AM  Result Value  Ref Range   Chlamydia Negative    Neisseria gonorrhea Negative   Lipase, blood   Collection Time: 06/28/15  7:16 PM  Result Value Ref Range   Lipase 27 11 - 51 U/L  Comprehensive metabolic panel   Collection Time: 06/28/15  7:16 PM  Result Value Ref Range   Sodium 138 135 - 145 mmol/L   Potassium 4.0 3.5 - 5.1 mmol/L   Chloride 102 101 - 111 mmol/L   CO2 25 22 - 32 mmol/L   Glucose, Bld 114 (H) 65 - 99 mg/dL   BUN 11 6 - 20 mg/dL   Creatinine, Ser 9.52 0.44 - 1.00 mg/dL   Calcium 9.1 8.9 - 84.1 mg/dL   Total Protein 7.8 6.5 - 8.1 g/dL   Albumin 4.5 3.5 - 5.0 g/dL   AST 20 15 - 41 U/L   ALT 15 14 - 54 U/L   Alkaline Phosphatase 55 38 - 126 U/L   Total Bilirubin 0.5 0.3 - 1.2 mg/dL   GFR calc non Af Amer >60 >60 mL/min   GFR calc Af Amer >60 >60 mL/min   Anion gap 11 5 - 15  CBC   Collection Time: 06/28/15  7:16 PM  Result Value Ref Range   WBC 12.7 (H) 4.0 - 10.5 K/uL   RBC 4.98 3.87 - 5.11 MIL/uL   Hemoglobin 14.1 12.0 - 15.0 g/dL   HCT 32.4 40.1 - 02.7 %   MCV 85.9 78.0 - 100.0 fL   MCH 28.3 26.0 - 34.0 pg   MCHC 32.9 30.0 - 36.0 g/dL   RDW 25.3 66.4 - 40.3 %   Platelets  357 150 - 400 K/uL  Differential   Collection Time: 06/28/15  7:16 PM  Result Value Ref Range   Neutrophils Relative % 72 %   Neutro Abs 8.8 (H) 1.7 - 7.7 K/uL   Lymphocytes Relative 23 %   Lymphs Abs 2.8 0.7 - 4.0 K/uL   Monocytes Relative 4 %   Monocytes Absolute 0.4 0.1 - 1.0 K/uL   Eosinophils Relative 1 %   Eosinophils Absolute 0.1 0.0 - 0.7 K/uL   Basophils Relative 0 %   Basophils Absolute 0.0 0.0 - 0.1 K/uL  I-Stat beta hCG blood, ED ([facility], [facility], [facility] only)   Collection Time: 06/28/15  7:24 PM  Result Value Ref Range   I-stat hCG, quantitative <5.0 <5 mIU/mL   Comment 3          Urinalysis, Routine w reflex microscopic (not at [facility])   Collection Time: 06/28/15 11:13 PM  Result Value Ref Range   Color, Urine YELLOW YELLOW   APPearance CLEAR CLEAR   Specific Gravity, Urine 1.036 (H) 1.005 - 1.030   pH 5.5 5.0 - 8.0   Glucose, UA NEGATIVE NEGATIVE mg/dL   Hgb urine dipstick SMALL (A) NEGATIVE   Bilirubin Urine NEGATIVE NEGATIVE   Ketones, ur 15 (A) NEGATIVE mg/dL   Protein, ur NEGATIVE NEGATIVE mg/dL   Nitrite NEGATIVE NEGATIVE   Leukocytes, UA NEGATIVE NEGATIVE  Urine microscopic-add on   Collection Time: 06/28/15 11:13 PM  Result Value Ref Range   Squamous Epithelial / LPF 6-30 (A) NONE SEEN   WBC, UA 0-5 0 - 5 WBC/hpf   RBC / HPF 6-30 0 - 5 RBC/hpf   Bacteria, UA FEW (A) NONE SEEN  Wet prep, genital   Collection Time: 06/28/15 11:36 PM  Result Value Ref Range   Yeast Wet Prep HPF POC NONE SEEN NONE SEEN  Trich, Wet Prep NONE SEEN NONE SEEN   Clue Cells Wet Prep HPF POC MANY (A) NONE SEEN   WBC, Wet Prep HPF POC MANY (A) NONE SEEN   Sperm NONE SEEN   Gentamicin level, random   Collection Time: 2015/07/02  2:52 PM  Result Value Ref Range   Gentamicin Rm 1.7 ug/mL  CBC WITH DIFFERENTIAL   Collection Time: 06/30/15  5:50 AM  Result Value Ref Range   WBC 11.9 (H) 4.0 - 10.5 K/uL   RBC 3.96 3.87 - 5.11 MIL/uL   Hemoglobin 11.2 (L) 12.0 - 15.0 g/dL   HCT 16.1 (L)  09.6 - 46.0 %   MCV 87.1 78.0 - 100.0 fL   MCH 28.3 26.0 - 34.0 pg   MCHC 32.5 30.0 - 36.0 g/dL   RDW 04.5 40.9 - 81.1 %   Platelets 265 150 - 400 K/uL   Neutrophils Relative % 81 %   Neutro Abs 9.6 (H) 1.7 - 7.7 K/uL   Lymphocytes Relative 14 %   Lymphs Abs 1.6 0.7 - 4.0 K/uL   Monocytes Relative 4 %   Monocytes Absolute 0.5 0.1 - 1.0 K/uL   Eosinophils Relative 1 %   Eosinophils Absolute 0.1 0.0 - 0.7 K/uL   Basophils Relative 0 %   Basophils Absolute 0.0 0.0 - 0.1 K/uL  Sedimentation rate   Collection Time: 06/30/15  5:50 AM  Result Value Ref Range   Sed Rate 23 (H) 0 - 22 mm/hr    EKG: No orders found for this or any previous visit.  Imaging Studies: US Transvaginal Non-ob  07/02/2015  CLINICAL DATA:  Bilateral pelvic cystic masses noted on CT. Further evaluation requested. Initial encounter. EXAM: TRANSABDOMINAL AND TRANSVAGINAL ULTRASOUND OF PELVIS DOPPLER ULTRASOUND OF OVARIES TECHNIQUE: Both transabdominal and transvaginal ultrasound examinations of the pelvis were performed. Transabdominal technique was performed for global imaging of the pelvis including uterus, ovaries, adnexal regions, and pelvic cul-de-sac. It was necessary to proceed with endovaginal exam following the transabdominal exam to visualize the ovaries in greater detail. Color and duplex Doppler ultrasound was utilized to evaluate blood flow to the ovaries. COMPARISON:  CT of the abdomen and pelvis performed 06/28/2015 FINDINGS: Uterus Measurements: 9.6 x 3.5 x 4.4 cm. No fibroids or other mass visualized. Endometrium Thickness: 1.2 cm.  No focal abnormality visualized. Right ovary Measurements: 6.3 x 5.6 x 5.3 cm. There is a large echogenic region within the right ovary, measuring 4.4 x 4.2 x 4.2 cm, which may reflect an endometrioma or possibly a hemorrhagic cyst. Left ovary Measurements: 13.0 x 3.6 x 6.1 cm. There is a larger complex cystic lesion at the left ovary, measuring 11.9 x 3.3 x 5.0 cm, with layering  increased attenuation, most likely reflecting a large organizing hemorrhagic cyst. Pulsed Doppler evaluation of both ovaries demonstrates normal low-resistance arterial and venous waveforms. Other findings Trace free fluid seen within the pelvic cul-de-sac, likely physiologic in nature. IMPRESSION: Echogenic region at the right ovary, measuring 4.4 cm, and a larger complex cystic lesion at the left ovary, measuring 11.9 x 3.3 x 5.0 cm. The left-sided lesion reflects a large organizing hemorrhagic cyst. The right ovarian region may reflect an endometrioma or hemorrhagic cyst. Would consider follow-up pelvic ultrasound in 2-3 months to assess for gradual resolution. Electronically Signed   By: Roanna Raider M.D.   On: July 02, 2015 00:58   US Pelvis Complete  2015-07-02  CLINICAL DATA:  Bilateral pelvic cystic masses noted on CT. Further evaluation requested. Initial encounter.  EXAM: TRANSABDOMINAL AND TRANSVAGINAL ULTRASOUND OF PELVIS DOPPLER ULTRASOUND OF OVARIES TECHNIQUE: Both transabdominal and transvaginal ultrasound examinations of the pelvis were performed. Transabdominal technique was performed for global imaging of the pelvis including uterus, ovaries, adnexal regions, and pelvic cul-de-sac. It was necessary to proceed with endovaginal exam following the transabdominal exam to visualize the ovaries in greater detail. Color and duplex Doppler ultrasound was utilized to evaluate blood flow to the ovaries. COMPARISON:  CT of the abdomen and pelvis performed 06/28/2015 FINDINGS: Uterus Measurements: 9.6 x 3.5 x 4.4 cm. No fibroids or other mass visualized. Endometrium Thickness: 1.2 cm.  No focal abnormality visualized. Right ovary Measurements: 6.3 x 5.6 x 5.3 cm. There is a large echogenic region within the right ovary, measuring 4.4 x 4.2 x 4.2 cm, which may reflect an endometrioma or possibly a hemorrhagic cyst. Left ovary Measurements: 13.0 x 3.6 x 6.1 cm. There is a larger complex cystic lesion at the  left ovary, measuring 11.9 x 3.3 x 5.0 cm, with layering increased attenuation, most likely reflecting a large organizing hemorrhagic cyst. Pulsed Doppler evaluation of both ovaries demonstrates normal low-resistance arterial and venous waveforms. Other findings Trace free fluid seen within the pelvic cul-de-sac, likely physiologic in nature. IMPRESSION: Echogenic region at the right ovary, measuring 4.4 cm, and a larger complex cystic lesion at the left ovary, measuring 11.9 x 3.3 x 5.0 cm. The left-sided lesion reflects a large organizing hemorrhagic cyst. The right ovarian region may reflect an endometrioma or hemorrhagic cyst. Would consider follow-up pelvic ultrasound in 2-3 months to assess for gradual resolution. Electronically Signed   By: Roanna Raider M.D.   On: 06/29/2015 00:58   Ct Abdomen Pelvis W Contrast  06/28/2015  CLINICAL DATA:  30 year old female with right lower quadrant abdominal pain EXAM: CT ABDOMEN AND PELVIS WITH CONTRAST TECHNIQUE: Multidetector CT imaging of the abdomen and pelvis was performed using the standard protocol following bolus administration of intravenous contrast. CONTRAST:  OMNIPAQUE IOHEXOL 300 MG/ML  SOLN COMPARISON:  None. FINDINGS: The visualized lung bases are clear. No intra-abdominal free air. Small free fluid. The liver, gallbladder, pancreas, spleen, adrenal glands, left kidney, visualized ureters, and urinary bladder appear unremarkable. There is a 3 mm nonobstructing right renal upper pole calculus. No hydronephrosis. The uterus is anteverted. There fluid containing structures within the pelvis presumably ovarian cyst measuring 6.1 x 5.3 cm on the right and 4.2 x 9.3 cm on the left. The left ovarian cyst demonstrates a thickened wall with smaller adjacent cystic lesions. There is inflammatory changes and stranding of the pelvic floor fat with extension of the inflammatory fluid throughout the abdomen. Findings may represent an infected complex ovarian  cysts versus less likely hydrosalpinx. Correlation with clinical exam recommend. Pelvic ultrasound with Doppler interrogation of the ovaries is recommended for further evaluation and exclude associated ovarian torsion. MRI may provide additional information pending ultrasound findings. There is no evidence of bowel obstruction or inflammation. Normal appendix. The abdominal aorta and IVC appear patent. No portal venous gas identified. There is no adenopathy. Small fat containing umbilical hernia. The abdominal wall soft tissues appear unremarkable. The osseous structures are intact. IMPRESSION: Bilateral pelvic cystic masses with associated inflammatory changes. Findings may represent complex an infected ovarian cyst versus less likely pyosalpinx. Pelvic ultrasound with Doppler interrogation of the ovaries is recommended for better evaluation of the cystic lesions and documentation of flow within the ovaries. MRI may provide additional evaluation depending on sonographic findings. No evidence of bowel obstruction or  inflammation.  Normal appendix. Electronically Signed   By: Elgie Collard M.D.   On: 06/28/2015 23:13   [place] Art/ven Flow Abd Pelv Doppler  06/29/2015  CLINICAL DATA:  Bilateral pelvic cystic masses noted on CT. Further evaluation requested. Initial encounter. EXAM: TRANSABDOMINAL AND TRANSVAGINAL ULTRASOUND OF PELVIS DOPPLER ULTRASOUND OF OVARIES TECHNIQUE: Both transabdominal and transvaginal ultrasound examinations of the pelvis were performed. Transabdominal technique was performed for global imaging of the pelvis including uterus, ovaries, adnexal regions, and pelvic cul-de-sac. It was necessary to proceed with endovaginal exam following the transabdominal exam to visualize the ovaries in greater detail. Color and duplex Doppler ultrasound was utilized to evaluate blood flow to the ovaries. COMPARISON:  CT of the abdomen and pelvis performed 06/28/2015 FINDINGS: Uterus Measurements: 9.6 x 3.5 x  4.4 cm. No fibroids or other mass visualized. Endometrium Thickness: 1.2 cm.  No focal abnormality visualized. Right ovary Measurements: 6.3 x 5.6 x 5.3 cm. There is a large echogenic region within the right ovary, measuring 4.4 x 4.2 x 4.2 cm, which may reflect an endometrioma or possibly a hemorrhagic cyst. Left ovary Measurements: 13.0 x 3.6 x 6.1 cm. There is a larger complex cystic lesion at the left ovary, measuring 11.9 x 3.3 x 5.0 cm, with layering increased attenuation, most likely reflecting a large organizing hemorrhagic cyst. Pulsed Doppler evaluation of both ovaries demonstrates normal low-resistance arterial and venous waveforms. Other findings Trace free fluid seen within the pelvic cul-de-sac, likely physiologic in nature. IMPRESSION: Echogenic region at the right ovary, measuring 4.4 cm, and a larger complex cystic lesion at the left ovary, measuring 11.9 x 3.3 x 5.0 cm. The left-sided lesion reflects a large organizing hemorrhagic cyst. The right ovarian region may reflect an endometrioma or hemorrhagic cyst. Would consider follow-up pelvic ultrasound in 2-3 months to assess for gradual resolution. Electronically Signed   By: Roanna Raider M.D.   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  The patient also understands the alternative  treatment options which were discussed in full.  All questions were answered.  Kriston Pasquarello H 07/04/2015 7:27 AM   Carlissa Pesola H 07/04/2015 7:25 AM

## 2015-07-04 NOTE — Anesthesia Preprocedure Evaluation (Signed)
Anesthesia Evaluation  Patient identified by MRN, date of birth, ID band Patient awake    Reviewed: Allergy & Precautions, NPO status , Patient's Chart, lab work & pertinent test results  Airway Mallampati: I  TM Distance: >3 FB     Dental  (+) Teeth Intact, Dental Advisory Given   Pulmonary former smoker,    breath sounds clear to auscultation       Cardiovascular negative cardio ROS   Rhythm:Regular Rate:Normal     Neuro/Psych PSYCHIATRIC DISORDERS Depression    GI/Hepatic negative GI ROS,   Endo/Other    Renal/GU      Musculoskeletal   Abdominal   Peds  Hematology   Anesthesia Other Findings   Reproductive/Obstetrics                             Anesthesia Physical Anesthesia Plan  ASA: II  Anesthesia Plan: General   Post-op Pain Management:    Induction: Intravenous  Airway Management Planned: Oral ETT  Additional Equipment:   Intra-op Plan:   Post-operative Plan: Extubation in OR  Informed Consent: I have reviewed the patients History and Physical, chart, labs and discussed the procedure including the risks, benefits and alternatives for the proposed anesthesia with the patient or authorized representative who has indicated his/her understanding and acceptance.     Plan Discussed with:   Anesthesia Plan Comments:         Anesthesia Quick Evaluation

## 2015-07-04 NOTE — Anesthesia Postprocedure Evaluation (Signed)
Anesthesia Post Note  Patient: Selena Mann  Procedure(s) Performed: Procedure(s) (LRB): EXPLORATORY LAPAROTOMY, REMOVAL OF BILATERAL OVARIAN ENDOMETRIOMAS (N/A)  Patient location during evaluation: PACU Anesthesia Type: General Level of consciousness: awake and patient cooperative Pain management: pain level controlled Vital Signs Assessment: post-procedure vital signs reviewed and stable Respiratory status: spontaneous breathing, nonlabored ventilation and patient connected to face mask oxygen Cardiovascular status: blood pressure returned to baseline and stable Postop Assessment: no signs of nausea or vomiting Anesthetic complications: no    Last Vitals:  Filed Vitals:   07/04/15 0735 07/04/15 1004  BP: 103/66 104/64  Pulse:    Temp:  36.4 C  Resp: 20 12    Last Pain:  Filed Vitals:   07/04/15 1010  PainSc: 7                  Fritz Cauthon J

## 2015-07-05 ENCOUNTER — Encounter (HOSPITAL_COMMUNITY): Payer: Self-pay | Admitting: Obstetrics & Gynecology

## 2015-07-05 LAB — BASIC METABOLIC PANEL WITH GFR
Anion gap: 5 (ref 5–15)
BUN: 5 mg/dL — ABNORMAL LOW (ref 6–20)
CO2: 25 mmol/L (ref 22–32)
Calcium: 7.9 mg/dL — ABNORMAL LOW (ref 8.9–10.3)
Chloride: 103 mmol/L (ref 101–111)
Creatinine, Ser: 0.81 mg/dL (ref 0.44–1.00)
GFR calc Af Amer: >60 mL/min
GFR calc non Af Amer: >60 mL/min
Glucose, Bld: 128 mg/dL — ABNORMAL HIGH (ref 65–99)
Potassium: 4 mmol/L (ref 3.5–5.1)
Sodium: 133 mmol/L — ABNORMAL LOW (ref 135–145)

## 2015-07-05 LAB — CBC
HCT: 29.4 % — ABNORMAL LOW (ref 36.0–46.0)
Hemoglobin: 9.8 g/dL — ABNORMAL LOW (ref 12.0–15.0)
MCH: 29 pg (ref 26.0–34.0)
MCHC: 33.3 g/dL (ref 30.0–36.0)
MCV: 87 fL (ref 78.0–100.0)
Platelets: 300 K/uL (ref 150–400)
RBC: 3.38 MIL/uL — ABNORMAL LOW (ref 3.87–5.11)
RDW: 13.3 % (ref 11.5–15.5)
WBC: 10.1 K/uL (ref 4.0–10.5)

## 2015-07-05 LAB — CBC WITH DIFFERENTIAL/PLATELET
BASOS PCT: 0 %
Basophils Absolute: 0 10*3/uL (ref 0.0–0.1)
EOS ABS: 0.1 10*3/uL (ref 0.0–0.7)
EOS PCT: 1 %
HEMATOCRIT: 28.2 % — AB (ref 36.0–46.0)
Hemoglobin: 9.4 g/dL — ABNORMAL LOW (ref 12.0–15.0)
Lymphocytes Relative: 11 %
Lymphs Abs: 1.1 10*3/uL (ref 0.7–4.0)
MCH: 28.7 pg (ref 26.0–34.0)
MCHC: 33.3 g/dL (ref 30.0–36.0)
MCV: 86 fL (ref 78.0–100.0)
MONO ABS: 0.9 10*3/uL (ref 0.1–1.0)
MONOS PCT: 9 %
NEUTROS ABS: 7.9 10*3/uL — AB (ref 1.7–7.7)
Neutrophils Relative %: 79 %
Platelets: 269 10*3/uL (ref 150–400)
RBC: 3.28 MIL/uL — ABNORMAL LOW (ref 3.87–5.11)
RDW: 13.1 % (ref 11.5–15.5)
WBC: 10 10*3/uL (ref 4.0–10.5)

## 2015-07-05 MED ORDER — DEXTROSE 5 % IV SOLN
1.0000 g | Freq: Two times a day (BID) | INTRAVENOUS | Status: DC
  Administered 2015-07-06 (×2): 1 g via INTRAVENOUS
  Filled 2015-07-05 (×4): qty 10

## 2015-07-05 MED ORDER — HYDROMORPHONE HCL 2 MG PO TABS
2.0000 mg | ORAL_TABLET | ORAL | Status: DC | PRN
  Administered 2015-07-05 (×2): 4 mg via ORAL
  Administered 2015-07-05: 2 mg via ORAL
  Administered 2015-07-05 – 2015-07-06 (×2): 4 mg via ORAL
  Administered 2015-07-06 – 2015-07-07 (×3): 2 mg via ORAL
  Filled 2015-07-05: qty 1
  Filled 2015-07-05 (×2): qty 2
  Filled 2015-07-05: qty 1
  Filled 2015-07-05: qty 2
  Filled 2015-07-05: qty 1
  Filled 2015-07-05: qty 2
  Filled 2015-07-05: qty 1

## 2015-07-05 MED ORDER — DEXTROSE 5 % IV SOLN
INTRAVENOUS | Status: AC
  Filled 2015-07-05: qty 10

## 2015-07-05 MED ORDER — ACETAMINOPHEN 325 MG PO TABS: 650.0000 mg | ORAL_TABLET | Freq: Four times a day (QID) | ORAL | Status: AC | PRN

## 2015-07-05 MED ORDER — KETOROLAC TROMETHAMINE 30 MG/ML IJ SOLN
30.0000 mg | Freq: Once | INTRAMUSCULAR | Status: AC
  Administered 2015-07-05: 30 mg via INTRAVENOUS
  Filled 2015-07-05: qty 1

## 2015-07-05 MED ORDER — KETOROLAC TROMETHAMINE 10 MG PO TABS
10.0000 mg | ORAL_TABLET | Freq: Three times a day (TID) | ORAL | Status: DC
  Administered 2015-07-05 – 2015-07-09 (×12): 10 mg via ORAL
  Filled 2015-07-05 (×12): qty 1

## 2015-07-05 MED ORDER — GUAIFENESIN 100 MG/5ML PO SOLN: 5.0000 mL | ORAL | Status: DC | PRN

## 2015-07-05 NOTE — Progress Notes (Signed)
1 Day Post-Op Procedure(s) (LRB): EXPLORATORY LAPAROTOMY, REMOVAL OF BILATERAL OVARIAN ENDOMETRIOMAS (N/A)  Subjective: Patient reports nausea, incisional pain, tolerating PO and no problems voiding.    Objective: I have reviewed patient's vital signs, intake and output, medications and labs.  General: alert, cooperative and no distress GI: soft, non-tender; bowel sounds normal; no masses,  no organomegaly and incision: clean, dry and intact  Assessment: s/p Procedure(s): EXPLORATORY LAPAROTOMY, REMOVAL OF BILATERAL OVARIAN ENDOMETRIOMAS (N/A): stable  Plan: Advance diet Encourage ambulation Advance to PO medication Discontinue IV fluids     EURE,LUTHER H 07/05/2015, 1:03 PM

## 2015-07-05 NOTE — Progress Notes (Signed)
Dr Emelda FearFerguson covering for Dr Despina HiddenEure notified of patient's temp 103 oral and complain of chest congestion, lungs sounds are diminished bilaterally, denies cough, oxygen sats 99% on room air, see new orders, update Dr.Ferguson as needed., patient and family made aware of new orders.

## 2015-07-06 ENCOUNTER — Observation Stay (HOSPITAL_COMMUNITY): Payer: BLUE CROSS/BLUE SHIELD

## 2015-07-06 LAB — CBC WITH DIFFERENTIAL/PLATELET
BASOS PCT: 0 %
Basophils Absolute: 0 10*3/uL (ref 0.0–0.1)
EOS ABS: 0.2 10*3/uL (ref 0.0–0.7)
EOS PCT: 2 %
HCT: 26.4 % — ABNORMAL LOW (ref 36.0–46.0)
Hemoglobin: 8.8 g/dL — ABNORMAL LOW (ref 12.0–15.0)
LYMPHS ABS: 1.4 10*3/uL (ref 0.7–4.0)
Lymphocytes Relative: 15 %
MCH: 29.1 pg (ref 26.0–34.0)
MCHC: 33.3 g/dL (ref 30.0–36.0)
MCV: 87.4 fL (ref 78.0–100.0)
MONO ABS: 1 10*3/uL (ref 0.1–1.0)
MONOS PCT: 11 %
NEUTROS PCT: 72 %
Neutro Abs: 6.7 10*3/uL (ref 1.7–7.7)
PLATELETS: 252 10*3/uL (ref 150–400)
RBC: 3.02 MIL/uL — ABNORMAL LOW (ref 3.87–5.11)
RDW: 13.2 % (ref 11.5–15.5)
WBC: 9.2 10*3/uL (ref 4.0–10.5)

## 2015-07-06 LAB — TYPE AND SCREEN
ABO/RH(D): O POS
ANTIBODY SCREEN: NEGATIVE

## 2015-07-06 MED ORDER — VANCOMYCIN HCL IN DEXTROSE 750-5 MG/150ML-% IV SOLN
750.0000 mg | Freq: Three times a day (TID) | INTRAVENOUS | Status: DC
  Administered 2015-07-07 – 2015-07-08 (×4): 750 mg via INTRAVENOUS
  Filled 2015-07-06 (×8): qty 150

## 2015-07-06 MED ORDER — DEXTROSE 5 % IV SOLN
500.0000 mg | INTRAVENOUS | Status: DC
  Administered 2015-07-06: 500 mg via INTRAVENOUS
  Filled 2015-07-06 (×4): qty 500

## 2015-07-06 MED ORDER — VANCOMYCIN HCL 10 G IV SOLR
1500.0000 mg | Freq: Once | INTRAVENOUS | Status: AC
  Administered 2015-07-06: 1500 mg via INTRAVENOUS
  Filled 2015-07-06: qty 1500

## 2015-07-06 MED ORDER — SACCHAROMYCES BOULARDII 250 MG PO CAPS
250.0000 mg | ORAL_CAPSULE | Freq: Two times a day (BID) | ORAL | Status: DC
  Administered 2015-07-06 – 2015-07-09 (×7): 250 mg via ORAL
  Filled 2015-07-06 (×7): qty 1

## 2015-07-06 MED ORDER — PIPERACILLIN-TAZOBACTAM 3.375 G IVPB
3.3750 g | Freq: Three times a day (TID) | INTRAVENOUS | Status: DC
  Administered 2015-07-06 – 2015-07-08 (×5): 3.375 g via INTRAVENOUS
  Filled 2015-07-06 (×4): qty 50

## 2015-07-06 NOTE — Progress Notes (Signed)
.2 Days Post-Op Procedure(s) (LRB): EXPLORATORY LAPAROTOMY, REMOVAL OF BILATERAL OVARIAN ENDOMETRIOMAS (N/A)+ Hospital Acquired Pneumonia  Subjective: Patient reports incisional pain, tolerating PO and no problems voiding.  Pt had a cold last week developed more of a cough last evening which has continued but not worsened  Objective: I have reviewed patient's vital signs, intake and output, medications, labs, pathology and radiology results.  General: alert, cooperative and no distress Resp: diminished breath sounds both bases GI: soft, non-tender; bowel sounds normal; no masses,  no organomegaly and incision: clean, dry and intact Vaginal Bleeding: minimal   Results for orders placed or performed during the hospital encounter of 07/04/15 (from the past 24 hour(s))  CBC with Differential/Platelet     Status: Abnormal   Collection Time: 07/05/15 11:15 PM  Result Value Ref Range   WBC 10.0 4.0 - 10.5 K/uL   RBC 3.28 (L) 3.87 - 5.11 MIL/uL   Hemoglobin 9.4 (L) 12.0 - 15.0 g/dL   HCT 45.428.2 (L) 09.836.0 - 11.946.0 %   MCV 86.0 78.0 - 100.0 fL   MCH 28.7 26.0 - 34.0 pg   MCHC 33.3 30.0 - 36.0 g/dL   RDW 14.713.1 82.911.5 - 56.215.5 %   Platelets 269 150 - 400 K/uL   Neutrophils Relative % 79 %   Neutro Abs 7.9 (H) 1.7 - 7.7 K/uL   Lymphocytes Relative 11 %   Lymphs Abs 1.1 0.7 - 4.0 K/uL   Monocytes Relative 9 %   Monocytes Absolute 0.9 0.1 - 1.0 K/uL   Eosinophils Relative 1 %   Eosinophils Absolute 0.1 0.0 - 0.7 K/uL   Basophils Relative 0 %   Basophils Absolute 0.0 0.0 - 0.1 K/uL  Culture, blood (routine x 2)     Status: None (Preliminary result)   Collection Time: 07/05/15 11:15 PM  Result Value Ref Range   Specimen Description BLOOD LEFT ANTECUBITAL    Special Requests      BOTTLES DRAWN AEROBIC AND ANAEROBIC AEB 6CC ANA 4CC   Culture NO GROWTH < 12 HOURS    Report Status PENDING   Culture, blood (routine x 2)     Status: None (Preliminary result)   Collection Time: 07/05/15 11:30 PM   Result Value Ref Range   Specimen Description BLOOD RIGHT ANTECUBITAL    Special Requests BOTTLES DRAWN AEROBIC AND ANAEROBIC 6CC EACH    Culture NO GROWTH < 12 HOURS    Report Status PENDING   CBC with Differential/Platelet     Status: Abnormal   Collection Time: 07/06/15  9:33 AM  Result Value Ref Range   WBC 9.2 4.0 - 10.5 K/uL   RBC 3.02 (L) 3.87 - 5.11 MIL/uL   Hemoglobin 8.8 (L) 12.0 - 15.0 g/dL   HCT 13.026.4 (L) 86.536.0 - 78.446.0 %   MCV 87.4 78.0 - 100.0 fL   MCH 29.1 26.0 - 34.0 pg   MCHC 33.3 30.0 - 36.0 g/dL   RDW 69.613.2 29.511.5 - 28.415.5 %   Platelets 252 150 - 400 K/uL   Neutrophils Relative % 72 %   Neutro Abs 6.7 1.7 - 7.7 K/uL   Lymphocytes Relative 15 %   Lymphs Abs 1.4 0.7 - 4.0 K/uL   Monocytes Relative 11 %   Monocytes Absolute 1.0 0.1 - 1.0 K/uL   Eosinophils Relative 2 %   Eosinophils Absolute 0.2 0.0 - 0.7 K/uL   Basophils Relative 0 %   Basophils Absolute 0.0 0.0 - 0.1 K/uL    Assessment: s/p Procedure(s):  EXPLORATORY LAPAROTOMY, REMOVAL OF BILATERAL OVARIAN ENDOMETRIOMAS (N/A): stable, progressing well and tolerating diet   Hospital Acquired Pneumonia:  Pt developed a cough and had a significant fever spike last evening.  Blood cultures x 2 done, begun on rocephin for ?CAP.  I added zithromax this am for broader CAP coverage of mycoplasma.  WBC actually less today, 9K with unremarkable shift.  I ordered a CXR this am which was delayed in being done, but shows a LML pneumonia.  Pt wa sin hospital x 4 days last week at [facility] so my concern was since she has a consolidated pneumonia on CXR she in fact has a HAP.  CAP generally will give, especailly myoplasm would give a less infiltrative picture.  I spoke with the hospitalist on call and he agreed to change therapy to vanc and zosym for HCAP coverage.  Rocephin and zithromax stopped.    Multiple loose stools:  Was on antibiotics last week being treated empirically for TOA which was not the case.  C difficile toxin PCR  ordered  Plan: Continue vanc and zosyn for LML pneumonia, for hospital acquired pathogens, increas respiratory toilet and ambulation Otherwise routine post op care     EURE,LUTHER H 07/06/2015, 9:29 PM

## 2015-07-06 NOTE — Progress Notes (Signed)
Started by nurse

## 2015-07-06 NOTE — Progress Notes (Signed)
ANTIBIOTIC CONSULT NOTE - INITIAL  Pharmacy Consult for vancomycin and zosyn Indication: pneumonia  No Known Allergies  Patient Measurements: Height: 5\' 4"  (162.6 cm) Weight: 160 lb (72.576 kg) IBW/kg (Calculated) : 54.7   Vital Signs: Temp: 98.9 F (37.2 C) (01/06 1347) Temp Source: Oral (01/06 1347) BP: 111/63 mmHg (01/06 1347) Pulse Rate: 95 (01/06 1347) Intake/Output from previous day: 01/05 0701 - 01/06 0700 In: 1113.8 [P.O.:240; I.V.:873.8] Out: 1600 [Urine:1600] Intake/Output from this shift:    Labs:  Recent Labs  07/05/15 0656 07/05/15 2315 07/06/15 0933  WBC 10.1 10.0 9.2  HGB 9.8* 9.4* 8.8*  PLT 300 269 252  CREATININE 0.81  --   --    Estimated Creatinine Clearance: 99.2 mL/min (by C-G formula based on Cr of 0.81). No results for input(s): VANCOTROUGH, VANCOPEAK, VANCORANDOM, GENTTROUGH, GENTPEAK, GENTRANDOM, TOBRATROUGH, TOBRAPEAK, TOBRARND, AMIKACINPEAK, AMIKACINTROU, AMIKACIN in the last 72 hours.   Microbiology: Recent Results (from the past 720 hour(s))  Wet prep, genital     Status: Abnormal   Collection Time: 06/28/15 11:36 PM  Result Value Ref Range Status   Yeast Wet Prep HPF POC NONE SEEN NONE SEEN Final   Trich, Wet Prep NONE SEEN NONE SEEN Final   Clue Cells Wet Prep HPF POC MANY (A) NONE SEEN Final   WBC, Wet Prep HPF POC MANY (A) NONE SEEN Final   Sperm NONE SEEN  Final  Culture, blood (routine x 2)     Status: None (Preliminary result)   Collection Time: 07/05/15 11:15 PM  Result Value Ref Range Status   Specimen Description BLOOD LEFT ANTECUBITAL  Final   Special Requests   Final    BOTTLES DRAWN AEROBIC AND ANAEROBIC AEB 6CC ANA 4CC   Culture NO GROWTH < 12 HOURS  Final   Report Status PENDING  Incomplete  Culture, blood (routine x 2)     Status: None (Preliminary result)   Collection Time: 07/05/15 11:30 PM  Result Value Ref Range Status   Specimen Description BLOOD RIGHT ANTECUBITAL  Final   Special Requests BOTTLES DRAWN  AEROBIC AND ANAEROBIC 6CC EACH  Final   Culture NO GROWTH < 12 HOURS  Final   Report Status PENDING  Incomplete    Medical History: Past Medical History  Diagnosis Date  . Endometriosis   . Depression     Medications:  Prescriptions prior to admission  Medication Sig Dispense Refill Last Dose  . diphenhydrAMINE (BENADRYL) 50 MG capsule Take 1 capsule (50 mg total) by mouth every 6 (six) hours as needed for itching. 30 capsule 0 07/03/2015 at 2230  . escitalopram (LEXAPRO) 20 MG tablet Take 20 mg by mouth daily.   07/03/2015 at 2230  . HYDROmorphone (DILAUDID) 2 MG tablet Take 1-2 tablets (2-4 mg total) by mouth every 4 (four) hours as needed for severe pain. 40 tablet 0 07/04/2015 at 0500  . ketorolac (TORADOL) 10 MG tablet Take 1 tablet (10 mg total) by mouth every 8 (eight) hours. 20 tablet 0 07/04/2015 at 0500  . loratadine (CLARITIN) 10 MG tablet Take 10 mg by mouth daily.   07/03/2015 at 2230  . Multiple Vitamin (MULTIVITAMIN WITH MINERALS) TABS tablet Take 1 tablet by mouth daily.   07/03/2015 at 2230  . Omega-3 Fatty Acids (FISH OIL) 1000 MG CAPS Take 1,000 mg by mouth daily.   07/03/2015 at 2230   Assessment: 30 yo lady to start empiric antibiotics for PNA.  She is s/p exp lap with removal of bilateral ovarian  endometriomas on 1/4.  Goal of Therapy:  Vancomycin trough level 15-20 mcg/ml  Plan:  Zosyn 3.375 gm IV q8 hours Vancomycin 1500 mg IV X 1 then 750 mg IV q8 hours F/u renal function, cultures and clinical course  Thanks for allowing pharmacy to be a part of this patient's care.  Talbert CageLora Martha Soltys, PharmD Clinical Pharmacist 07/06/2015,8:51 PM

## 2015-07-07 DIAGNOSIS — Y95 Nosocomial condition: Secondary | ICD-10-CM | POA: Diagnosis present

## 2015-07-07 DIAGNOSIS — J189 Pneumonia, unspecified organism: Secondary | ICD-10-CM | POA: Diagnosis present

## 2015-07-07 DIAGNOSIS — A047 Enterocolitis due to Clostridium difficile: Secondary | ICD-10-CM | POA: Diagnosis present

## 2015-07-07 DIAGNOSIS — N801 Endometriosis of ovary: Secondary | ICD-10-CM | POA: Diagnosis present

## 2015-07-07 DIAGNOSIS — D649 Anemia, unspecified: Secondary | ICD-10-CM | POA: Diagnosis present

## 2015-07-07 DIAGNOSIS — Z87891 Personal history of nicotine dependence: Secondary | ICD-10-CM | POA: Diagnosis not present

## 2015-07-07 LAB — CBC WITH DIFFERENTIAL/PLATELET
Basophils Absolute: 0 10*3/uL (ref 0.0–0.1)
Basophils Relative: 0 %
EOS ABS: 0.2 10*3/uL (ref 0.0–0.7)
Eosinophils Relative: 3 %
HEMATOCRIT: 26.5 % — AB (ref 36.0–46.0)
HEMOGLOBIN: 8.9 g/dL — AB (ref 12.0–15.0)
LYMPHS ABS: 2 10*3/uL (ref 0.7–4.0)
Lymphocytes Relative: 25 %
MCH: 29.1 pg (ref 26.0–34.0)
MCHC: 33.6 g/dL (ref 30.0–36.0)
MCV: 86.6 fL (ref 78.0–100.0)
MONOS PCT: 10 %
Monocytes Absolute: 0.8 10*3/uL (ref 0.1–1.0)
NEUTROS ABS: 5.2 10*3/uL (ref 1.7–7.7)
NEUTROS PCT: 62 %
Platelets: 296 10*3/uL (ref 150–400)
RBC: 3.06 MIL/uL — ABNORMAL LOW (ref 3.87–5.11)
RDW: 13.3 % (ref 11.5–15.5)
WBC: 8.3 10*3/uL (ref 4.0–10.5)

## 2015-07-07 LAB — C DIFFICILE QUICK SCREEN W PCR REFLEX
C DIFFICILE (CDIFF) INTERP: POSITIVE
C Diff antigen: POSITIVE — AB
C Diff toxin: POSITIVE — AB

## 2015-07-07 MED ORDER — METRONIDAZOLE 500 MG PO TABS
500.0000 mg | ORAL_TABLET | Freq: Three times a day (TID) | ORAL | Status: DC
  Administered 2015-07-07 – 2015-07-09 (×6): 500 mg via ORAL
  Filled 2015-07-07 (×6): qty 1

## 2015-07-07 MED ORDER — LEUPROLIDE ACETATE 7.5 MG IM KIT
7.5000 mg | PACK | Freq: Once | INTRAMUSCULAR | Status: AC
  Administered 2015-07-07: 7.5 mg via INTRAMUSCULAR
  Filled 2015-07-07: qty 7.5

## 2015-07-07 MED ORDER — VANCOMYCIN HCL IN DEXTROSE 750-5 MG/150ML-% IV SOLN
INTRAVENOUS | Status: AC
  Filled 2015-07-07: qty 150

## 2015-07-07 MED ORDER — SODIUM CHLORIDE 0.9 % IV SOLN
510.0000 mg | Freq: Once | INTRAVENOUS | Status: AC
  Administered 2015-07-07: 510 mg via INTRAVENOUS
  Filled 2015-07-07: qty 17

## 2015-07-07 NOTE — Progress Notes (Signed)
Patient positive for Cdiff. Dr. Despina HiddenEure notified. Patient is on enteric precautions.

## 2015-07-07 NOTE — Progress Notes (Addendum)
3 Days Post-Op Procedure(s) (LRB): EXPLORATORY LAPAROTOMY, REMOVAL OF BILATERAL OVARIAN ENDOMETRIOMAS (N/A)  Subjective: Patient reports incisional pain, tolerating PO and no problems voiding.   Since yesterday her BMs have begum watery and fecal matter Some coughing not productive, improved  Objective: I have reviewed patient's vital signs, intake and output, medications, labs, microbiology, pathology and radiology results.  General: alert, cooperative and no distress Resp: diminished breath sounds no consolidation or wheezes GI: soft, non-tender; bowel sounds normal; no masses,  no organomegaly and incision: clean, dry and intact  Assessment: 1.  s/p Procedure(s): EXPLORATORY LAPAROTOMY, REMOVAL OF BILATERAL OVARIAN ENDOMETRIOMAS (N/A): progressing well outpatient Lupron 11.25 mg  2.  HCAP:Blood cultures negative to this point, on Vancomycin and zosyn, will transition to augmentin orally as an outpatient   3.. Positive C difficile toxin:  Will begin oral flagyl  4.  Anemia: stabilized, if febrile morbidity continues will consider transfusion, in the meantime will give a dose of IV feraheme    If continues to improve anticipate discharge on Monday  Cambell Stanek H 07/07/2015, 11:09 AM

## 2015-07-08 MED ORDER — AMOXICILLIN-POT CLAVULANATE 875-125 MG PO TABS
1.0000 | ORAL_TABLET | Freq: Two times a day (BID) | ORAL | Status: DC
  Administered 2015-07-08 – 2015-07-09 (×3): 1 via ORAL
  Filled 2015-07-08 (×3): qty 1

## 2015-07-09 LAB — BASIC METABOLIC PANEL
ANION GAP: 7 (ref 5–15)
BUN: 6 mg/dL (ref 6–20)
CALCIUM: 8.2 mg/dL — AB (ref 8.9–10.3)
CO2: 28 mmol/L (ref 22–32)
Chloride: 105 mmol/L (ref 101–111)
Creatinine, Ser: 0.76 mg/dL (ref 0.44–1.00)
GFR calc Af Amer: >60 mL/min (ref >60)
GLUCOSE: 90 mg/dL (ref 65–99)
POTASSIUM: 3.9 mmol/L (ref 3.5–5.1)
SODIUM: 140 mmol/L (ref 135–145)

## 2015-07-09 LAB — GLUCOSE, CAPILLARY: GLUCOSE-CAPILLARY: 82 mg/dL (ref 65–99)

## 2015-07-09 MED ORDER — METRONIDAZOLE 500 MG PO TABS
500.0000 mg | ORAL_TABLET | Freq: Three times a day (TID) | ORAL | Status: DC
Start: 2015-07-09 — End: 2015-08-20

## 2015-07-09 MED ORDER — SACCHAROMYCES BOULARDII 250 MG PO CAPS: 250.0000 mg | ORAL_CAPSULE | Freq: Two times a day (BID) | ORAL | Status: DC

## 2015-07-09 MED ORDER — KETOROLAC TROMETHAMINE 10 MG PO TABS: 10.0000 mg | ORAL_TABLET | Freq: Three times a day (TID) | ORAL | Status: DC

## 2015-07-09 MED ORDER — ONDANSETRON HCL 8 MG PO TABS: 8.0000 mg | ORAL_TABLET | Freq: Four times a day (QID) | ORAL | Status: DC | PRN

## 2015-07-09 MED ORDER — AMOXICILLIN-POT CLAVULANATE 875-125 MG PO TABS: 1.0000 | ORAL_TABLET | Freq: Two times a day (BID) | ORAL | Status: DC

## 2015-07-09 MED ORDER — OXYCODONE-ACETAMINOPHEN 5-325 MG PO TABS: 1.0000 | ORAL_TABLET | ORAL | Status: DC | PRN

## 2015-07-09 NOTE — Discharge Instructions (Signed)
Endometriosis Endometriosis is a condition in which the tissue that lines the uterus (endometrium) grows outside of its normal location. The tissue may grow in many locations close to the uterus, but it commonly grows on the ovaries, fallopian tubes, vagina, or bowel. Because the uterus expels, or sheds, its lining every menstrual cycle, there is bleeding wherever the endometrial tissue is located. This can cause pain because blood is irritating to tissues not normally exposed to it.  CAUSES  The cause of endometriosis is not known.  SIGNS AND SYMPTOMS  Often, there are no symptoms. When symptoms are present, they can vary with the location of the displaced tissue. Various symptoms can occur at different times. Although symptoms occur mainly during a woman's menstrual period, they can also occur midcycle and usually stop with menopause. Some people may go months with no symptoms at all. Symptoms may include:   Back or abdominal pain.   Heavier bleeding during periods.   Pain during intercourse.   Painful bowel movements.   Infertility. DIAGNOSIS  Your health care provider will do a physical exam and ask about your symptoms. Various tests may be done, such as:   Blood tests and urine tests. These are done to help rule out other problems.   Ultrasound. This test is done to look for abnormal tissue.   An X-ray of the lower bowel (barium enema).  Laparoscopy. In this procedure, a thin, lighted tube with a tiny camera on the end (laparoscope) is inserted into your abdomen. This helps your health care provider look for abnormal tissue to confirm the diagnosis. The health care provider may also remove a small piece of tissue (biopsy) from any abnormal tissue found. This tissue sample can then be sent to a lab so it can be looked at under a microscope. TREATMENT  Treatment will vary and may include:   Medicines to relieve pain. Nonsteroidal anti-inflammatory drugs (NSAIDs) are a type of  pain medicine that can help to relieve the pain caused by endometriosis.  Hormonal therapy. When using hormonal therapy, periods are eliminated. This eliminates the monthly exposure to blood by the displaced endometrial tissue.   Surgery. Surgery may sometimes be done to remove the abnormal endometrial tissue. In severe cases, surgery may be done to remove the fallopian tubes, uterus, and ovaries (hysterectomy). HOME CARE INSTRUCTIONS   Take all medicines as directed by your health care provider. Do not take aspirin because it may increase bleeding when you are not on hormonal therapy.   Avoid activities that produce pain, including sexual activity. SEEK MEDICAL CARE IF:  You have pelvic pain before, after, or during your periods.  You have pelvic pain between periods that gets worse during your period.  You have pelvic pain during or after sex.  You have pelvic pain with bowel movements or urination, especially during your period.  You have problems getting pregnant.  You have a fever. SEEK IMMEDIATE MEDICAL CARE IF:   Your pain is severe and is not responding to pain medicine.   You have severe nausea and vomiting, or you cannot keep foods down.   You have pain that is limited to the right lower part of your abdomen.   You have swelling or increasing pain in your abdomen.   You see blood in your stool.  MAKE SURE YOU:   Understand these instructions.  Will watch your condition.  Will get help right away if you are not doing well or get worse.   This information   is not intended to replace advice given to you by your health care provider. Make sure you discuss any questions you have with your health care provider.   Document Released: 06/13/2000 Document Revised: 07/07/2014 Document Reviewed: 02/11/2013 [employer] Interactive Patient Education 2016 [employer]  

## 2015-07-09 NOTE — Progress Notes (Signed)
1120 Patient given d/c instructions, paperwork and hard Rx given to patient. Detailed patient instructions given to patient regarding C.Diff, s/s to monitor for, hydration and cleaning surfaces appropriately. IV catheter removed w/no s/s of infection noted, patient tolerated well. Patient aware to pick up other Rxs at her pharmacy in [place]. Patient's significant other present at bedside and transported patient home. Patient assisted to vehicle via w/c by staff.

## 2015-07-09 NOTE — Care Management Note (Signed)
Case Management Note  Patient Details  Name: Selena Mann MRN: [id number] Date of Birth: 1986-06-22  Subjective/Objective:                  Pt admitted from home s/p surgery and then development of pneumonia. Pt lives with her partner and will return home at discharge. Pt is independent with ADL's.  Action/Plan: Pt for discharge home today. No CM needs noted.  Expected Discharge Date:  07/05/15               Expected Discharge Plan:  Home/Self Care  In-House Referral:  NA  Discharge planning Services  CM Consult  Post Acute Care Choice:  NA Choice offered to:  NA  DME Arranged:    DME Agency:     HH Arranged:    HH Agency:     Status of Service:  Completed, signed off  Medicare Important Message Given:    Date Medicare IM Given:    Medicare IM give by:    Date Additional Medicare IM Given:    Additional Medicare Important Message give by:     If discussed at Long Length of Stay Meetings, dates discussed:    Additional Comments:  Cheryl FlashBlackwell, Abhinav Mayorquin Crowder, RN 07/09/2015, 10:55 AM

## 2015-07-09 NOTE — Discharge Summary (Signed)
Physician Discharge Summary  Patient ID: Selena Mann MRN: [id number] DOB/AGE: Jun 15, 1986 30 y.o.  Admit date: 07/04/2015 Discharge date: 07/09/2015  Admission Diagnoses:  Discharge Diagnoses:  Active Problems:   S/P ovarian cystectomy   HCAP (healthcare-associated pneumonia) +C. Difficile   Discharged Condition: stable  Hospital Course: Pt underwent conservative surgery and ovaries were preserved with removal of both ofher endometriomas.  Surgery was uncomplicated.  Post operatively she had a significant febrile episode and CXR revealed LML pneumonia.  She had been in the hospital the week prior and I treated her for HCAP.  Additionally she developed watery stools and a C difficile toxin was positive and flagyl was begun.  She had no further febrile episodes and was transitioned to oral Augmenitn.  Surgically otherwise there were no complications  She was given lupron prior to discharge for endometriosis suppression for the next 2 months  Consults: None  Significant Diagnostic Studies:   Treatments: antibiotics: vancomycin, Zosyn and metronidazole, augmentin  Discharge Exam: Blood pressure 108/62, pulse 64, temperature 97.5 F (36.4 C), temperature source Oral, resp. rate 20, height 5\' 4"  (1.626 m), weight 160 lb (72.576 kg), last menstrual period 07/06/2015, SpO2 97 %. General appearance: alert, cooperative and no distress Resp: diminished breath sounds LLL GI: soft, non-tender; bowel sounds normal; no masses,  no organomegaly Incision/Wound:clean dry intact  Disposition: 81-Discharged to home/self-care with a planned acute care hospital inpt readmission  Discharge Instructions    Call MD for:  persistant nausea and vomiting    Complete by:  As directed      Call MD for:  severe uncontrolled pain    Complete by:  As directed      Call MD for:  temperature >100.4    Complete by:  As directed      Diet - low sodium heart healthy    Complete by:  As directed      Discharge wound care:    Complete by:  As directed   Keep clean dry intact     Driving Restrictions    Complete by:  As directed   No driving for 1 more week     Increase activity slowly    Complete by:  As directed      Lifting restrictions    Complete by:  As directed   Do not lift more than 10 pounds     Sexual Activity Restrictions    Complete by:  As directed   None for 3 weeks            Medication List    STOP taking these medications        diphenhydrAMINE 50 MG capsule  Commonly known as:  BENADRYL     HYDROmorphone 2 MG tablet  Commonly known as:  DILAUDID      TAKE these medications        amoxicillin-clavulanate 875-125 MG tablet  Commonly known as:  AUGMENTIN  Take 1 tablet by mouth every 12 (twelve) hours.     escitalopram 20 MG tablet  Commonly known as:  LEXAPRO  Take 20 mg by mouth daily.     Fish Oil 1000 MG Caps  Take 1,000 mg by mouth daily.     ketorolac 10 MG tablet  Commonly known as:  TORADOL  Take 1 tablet (10 mg total) by mouth every 8 (eight) hours.     ketorolac 10 MG tablet  Commonly known as:  TORADOL  Take 1 tablet (10 mg total) by mouth every  8 (eight) hours.     loratadine 10 MG tablet  Commonly known as:  CLARITIN  Take 10 mg by mouth daily.     metroNIDAZOLE 500 MG tablet  Commonly known as:  FLAGYL  Take 1 tablet (500 mg total) by mouth every 8 (eight) hours.     multivitamin with minerals Tabs tablet  Take 1 tablet by mouth daily.     ondansetron 8 MG tablet  Commonly known as:  ZOFRAN  Take 1 tablet (8 mg total) by mouth every 6 (six) hours as needed for nausea.     oxyCODONE-acetaminophen 5-325 MG tablet  Commonly known as:  PERCOCET/ROXICET  Take 1-2 tablets by mouth every 4 (four) hours as needed (moderate to severe pain (when tolerating fluids)).     saccharomyces boulardii 250 MG capsule  Commonly known as:  FLORASTOR  Take 1 capsule (250 mg total) by mouth 2 (two) times daily.           Follow-up  Information    Follow up with Lazaro Arms, MD In 1 week.   Specialties:  Obstetrics and Gynecology, Radiology   Why:  post op visit   Contact information:   [address] [place] [address] [phone]       Signed: Lazaro Arms 07/09/2015, 10:29 AM

## 2015-07-09 NOTE — Progress Notes (Signed)
4 days post op Procedure(s) (LRB): EXPLORATORY LAPAROTOMY, REMOVAL OF BILATERAL OVARIAN ENDOMETRIOMAS (N/A)  Subjective: Patient reports incisional pain, tolerating PO and no problems voiding.   Since yesterday her BMs have become fewer and somewhat formed Some coughing not productive, improved  Objective: I have reviewed patient's vital signs, intake and output, medications, labs, microbiology, pathology and radiology results.  General: alert, cooperative and no distress Resp: diminished breath sounds no consolidation or wheezes GI: soft, non-tender; bowel sounds normal; no masses,  no organomegaly and incision: clean, dry and intact  Assessment: 1.  s/p Procedure(s): EXPLORATORY LAPAROTOMY, REMOVAL OF BILATERAL OVARIAN ENDOMETRIOMAS (N/A): progressing well outpatient Lupron 11.25 mg  2.  HCAP:Blood cultures negative to this point, on Vancomycin and zosyn, will transition to augmentin orally and continue as an outpatient   3.. Positive C difficile toxin:  on oral flagyl  4.  Anemia: stabilized, if febrile morbidity continues will consider transfusion, in the meantime given a dose of IV feraheme    If continues to improve anticipate dischargetomorrow  EURE,LUTHER H

## 2015-07-13 LAB — CULTURE, BLOOD (ROUTINE X 2)
Culture: NO GROWTH
Culture: NO GROWTH

## 2015-07-16 ENCOUNTER — Encounter: Payer: Self-pay | Admitting: Obstetrics & Gynecology

## 2015-07-16 ENCOUNTER — Ambulatory Visit (INDEPENDENT_AMBULATORY_CARE_PROVIDER_SITE_OTHER): Payer: BLUE CROSS/BLUE SHIELD | Admitting: Obstetrics & Gynecology

## 2015-07-16 VITALS — BP 108/72 | HR 92 | Ht 64.0 in | Wt 175.0 lb

## 2015-07-16 DIAGNOSIS — N801 Endometriosis of ovary: Secondary | ICD-10-CM

## 2015-07-16 DIAGNOSIS — Z9889 Other specified postprocedural states: Secondary | ICD-10-CM

## 2015-07-16 DIAGNOSIS — N80129 Deep endometriosis of ovary, unspecified ovary: Secondary | ICD-10-CM

## 2015-07-16 MED ORDER — OXYCODONE-ACETAMINOPHEN 5-325 MG PO TABS: 1.0000 | ORAL_TABLET | ORAL | Status: DC | PRN

## 2015-07-16 NOTE — Progress Notes (Signed)
Patient ID: Selena Mann, female   DOB: 24-Aug-1985, 30 y.o.   MRN: [id number]  HPI: Patient returns for routine postoperative follow-up having undergone bilateral removal of endometriomas on 07/04/2015.  The patient's immediate postoperative recovery has been significant for LML pneumonia and C diff Since hospital discharge the patient reports incision pain, diarrhea better not resolved.   Current Outpatient Prescriptions: amoxicillin-clavulanate (AUGMENTIN) 875-125 MG tablet, Take 1 tablet by mouth every 12 (twelve) hours., Disp: 16 tablet, Rfl: 0 escitalopram (LEXAPRO) 20 MG tablet, Take 20 mg by mouth daily., Disp: , Rfl:  metroNIDAZOLE (FLAGYL) 500 MG tablet, Take 1 tablet (500 mg total) by mouth every 8 (eight) hours., Disp: 42 tablet, Rfl: 0 Multiple Vitamin (MULTIVITAMIN WITH MINERALS) TABS tablet, Take 1 tablet by mouth daily., Disp: , Rfl:  Omega-3 Fatty Acids (FISH OIL) 1000 MG CAPS, Take 1,000 mg by mouth daily., Disp: , Rfl:  ondansetron (ZOFRAN) 8 MG tablet, Take 1 tablet (8 mg total) by mouth every 6 (six) hours as needed for nausea., Disp: 20 tablet, Rfl: 0 oxyCODONE-acetaminophen (PERCOCET/ROXICET) 5-325 MG tablet, Take 1-2 tablets by mouth every 4 (four) hours as needed (moderate to severe pain (when tolerating fluids))., Disp: 40 tablet, Rfl: 0 saccharomyces boulardii (FLORASTOR) 250 MG capsule, Take 1 capsule (250 mg total) by mouth 2 (two) times daily., Disp: 60 capsule, Rfl: 1 ketorolac (TORADOL) 10 MG tablet, Take 1 tablet (10 mg total) by mouth every 8 (eight) hours. (Patient not taking: Reported on 07/16/2015), Disp: 20 tablet, Rfl: 0 ketorolac (TORADOL) 10 MG tablet, Take 1 tablet (10 mg total) by mouth every 8 (eight) hours. (Patient not taking: Reported on 07/16/2015), Disp: 20 tablet, Rfl: 0 loratadine (CLARITIN) 10 MG tablet, Take 10 mg by mouth daily. Reported on 07/16/2015, Disp: , Rfl:   No current facility-administered medications for this visit.    Blood pressure  108/72, pulse 92, height 5\' 4"  (1.626 m), weight 175 lb (79.379 kg), last menstrual period 07/06/2015.  Physical Exam: Incision clean dry intact Abdomen soft benign  Diagnostic Tests:   Pathology: endometriomas  Impression: S/p bilateral excision of endometriomas LML pneumonia C diff  Plan: S/p lupron injection 2 month which will be up on 09/03/2015   Follow up: 5 weeks    Lazaro ArmsEURE,Rakwon Letourneau H, MD

## 2015-08-20 ENCOUNTER — Encounter: Payer: Self-pay | Admitting: Obstetrics & Gynecology

## 2015-08-20 ENCOUNTER — Ambulatory Visit (INDEPENDENT_AMBULATORY_CARE_PROVIDER_SITE_OTHER): Payer: BLUE CROSS/BLUE SHIELD | Admitting: Obstetrics & Gynecology

## 2015-08-20 VITALS — BP 90/60 | HR 74 | Wt 172.0 lb

## 2015-08-20 DIAGNOSIS — N801 Endometriosis of ovary: Secondary | ICD-10-CM

## 2015-08-20 DIAGNOSIS — N80129 Deep endometriosis of ovary, unspecified ovary: Secondary | ICD-10-CM

## 2015-08-20 DIAGNOSIS — N809 Endometriosis, unspecified: Secondary | ICD-10-CM

## 2015-08-20 DIAGNOSIS — Z9889 Other specified postprocedural states: Secondary | ICD-10-CM

## 2015-08-20 MED ORDER — LEUPROLIDE ACETATE (3 MONTH) 11.25 MG IM KIT: 11.2500 mg | PACK | INTRAMUSCULAR | Status: DC

## 2015-08-20 NOTE — Progress Notes (Signed)
Patient ID: Selena Mann, female   DOB: September 17, 1985, 30 y.o.   MRN: [id number] Pt in 6 weeks post op from exp lap with resection of 2 endoemtriomas, quite large S/p lupron 7.5 mg dose Due repeat 3 month dose 3/5/ or so  Exam  Incision clean dry intact Bimanual with small ovaries minimally tender and normal uterus small No masses otherwise  Severe endometriosis s/p lupron, repeat dose coming up  Return in about 4 months (around 12/18/2015) for Follow up, with Dr Despina Hidden.

## 2015-09-14 ENCOUNTER — Other Ambulatory Visit: Payer: Self-pay | Admitting: Obstetrics & Gynecology

## 2015-09-14 ENCOUNTER — Telehealth: Payer: Self-pay | Admitting: *Deleted

## 2015-09-14 MED ORDER — KETOROLAC TROMETHAMINE 10 MG PO TABS: 10.0000 mg | ORAL_TABLET | Freq: Three times a day (TID) | ORAL | Status: DC | PRN

## 2015-09-14 NOTE — Telephone Encounter (Signed)
Per our conversation, i e prescribed Toradol  If continues will prescribe her narcotic on Monday since she will have to come pick up script

## 2015-09-14 NOTE — Telephone Encounter (Signed)
Pt taking Lupron for endometrosis, having some vaginal spotting and ovarian pain, is this normal?

## 2015-11-20 ENCOUNTER — Other Ambulatory Visit: Payer: Self-pay | Admitting: Obstetrics & Gynecology

## 2015-12-24 ENCOUNTER — Ambulatory Visit (INDEPENDENT_AMBULATORY_CARE_PROVIDER_SITE_OTHER): Payer: BLUE CROSS/BLUE SHIELD | Admitting: Obstetrics & Gynecology

## 2015-12-24 ENCOUNTER — Other Ambulatory Visit (INDEPENDENT_AMBULATORY_CARE_PROVIDER_SITE_OTHER): Payer: BLUE CROSS/BLUE SHIELD

## 2015-12-24 ENCOUNTER — Encounter: Payer: Self-pay | Admitting: Obstetrics & Gynecology

## 2015-12-24 VITALS — BP 120/86 | HR 74 | Ht 64.0 in | Wt 197.0 lb

## 2015-12-24 DIAGNOSIS — N854 Malposition of uterus: Secondary | ICD-10-CM | POA: Diagnosis not present

## 2015-12-24 DIAGNOSIS — N801 Endometriosis of ovary: Secondary | ICD-10-CM

## 2015-12-24 DIAGNOSIS — N80129 Deep endometriosis of ovary, unspecified ovary: Secondary | ICD-10-CM

## 2015-12-24 DIAGNOSIS — N809 Endometriosis, unspecified: Secondary | ICD-10-CM | POA: Diagnosis not present

## 2015-12-24 MED ORDER — KETOROLAC TROMETHAMINE 10 MG PO TABS: 10.0000 mg | ORAL_TABLET | Freq: Three times a day (TID) | ORAL | Status: DC | PRN

## 2015-12-24 MED ORDER — ONDANSETRON 8 MG PO TBDP: 8.0000 mg | ORAL_TABLET | Freq: Three times a day (TID) | ORAL | Status: DC | PRN

## 2015-12-24 MED ORDER — NORETHIN-ETH ESTRAD-FE BIPHAS 1 MG-10 MCG / 10 MCG PO TABS: 1.0000 | ORAL_TABLET | Freq: Every day | ORAL | Status: DC

## 2015-12-24 NOTE — Progress Notes (Signed)
Patient ID: Selena Mann, female   DOB: 1985/09/22, 30 y.o.   MRN: [id number]      Chief Complaint  Patient presents with  . Follow-up    on endometrosis; weight gain; breasts leaking    Blood pressure 120/86, pulse 74, height 5\' 4"  (1.626 m), weight 197 lb (89.359 kg), last menstrual period 09/04/2015.  29 y.o. G1P0010 Patient's last menstrual period was 09/04/2015 (approximate). The current method of family planning is lupron.  Subjective Pt s/p exp lap with bilateral ovarian resections for endometriomas and severe pelvic endometriosis Has been on lupron post operatively and now will transition to continuous OCP, will go estrogen light progesterone heavy  Objective See sonogram report:     GYNECOLOGIC SONOGRAM   Selena Mann is a 30 y.o. G1P0010 ]LMP 09/04/2015 for a pelvic sonogram for f/u bilat endometrioma.  Uterus 7.1 x 3.1 x 7.8 cm, homogenous anteverted uterus   Endometrium 7.8 mm, symmetrical, wnl  Right ovary 3.0 x 1.8 x 2.5 cm, wnl  Left ovary 4.5 x 3.9 x 1.9 cm, wnl  No free fluid,no pain during ultrasound  Technician Comments:  PELVIC US TA/TV: homogenous anteverted uterus,EEC 7.8 mm,normal ov's bilat (mobile),no free fluid,no pain during ultrasound    E. I. du Pontmber J Carl 12/24/2015 3:53 PM  Clinical Impression and recommendations:  I have reviewed the sonogram results above, combined with the patient's current clinical course, below are my impressions and any appropriate recommendations for management based on the sonographic findings.  No evidence of endometriomas of the ovary or other pelvic masses Uterus and ovaries are normal   EURE,LUTHER H 12/24/2015 3:59 PM     Pertinent ROS Hot flushes are abating  Labs or studies     Impression Diagnoses this Encounter::   ICD-9-CM ICD-10-CM   1. 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  Endometriosis, severe 617.9 N80.9 US Transvaginal Non-OB     US Pelvis Complete    Established relevant diagnosis(es):   Plan/Recommendations: Meds ordered this encounter  Medications  . TRINTELLIX 10 MG TABS    Sig: Take 1 tablet by mouth at bedtime.    Refill:  0  . Norethindrone-Ethinyl Estradiol-Fe Biphas (LO LOESTRIN FE) 1 MG-10 MCG / 10 MCG tablet    Sig: Take 1 tablet by mouth daily. Take 1 daily, continuously, no "off" pills because patient has severe endometriosis    Dispense:  1 Package    Refill:  13  . ketorolac (TORADOL) 10 MG tablet    Sig: Take 1 tablet (10 mg total) by mouth every 8 (eight) hours as needed.    Dispense:  15 tablet    Refill:  3  . ondansetron (ZOFRAN ODT) 8 MG disintegrating tablet    Sig: Take 1 tablet (8 mg total) by mouth every 8 (eight) hours as needed for nausea or vomiting.    Dispense:  20 tablet    Refill:  1    Labs or Scans Ordered: Orders Placed This Encounter  Procedures  . US Transvaginal Non-OB  . US Pelvis Complete    Management:: Continuous OCP, lo lo estrin  Follow up Return in about 6 months (around 06/24/2016) for Follow up, with Dr Despina HiddenEure.        Face to face time:  15 minutes  Greater than 50% of the visit time was spent in counseling and coordination of care with the patient.  The summary and outline of the counseling and care coordination  is summarized in the note above.   All questions were answered.

## 2015-12-24 NOTE — Progress Notes (Signed)
PELVIC US TA/TV: homogenous anteverted uterus,EEC 7.8 mm,normal ov's bilat (mobile),no free fluid,no pain during ultrasound

## 2016-03-24 ENCOUNTER — Telehealth: Payer: Self-pay | Admitting: *Deleted

## 2016-03-24 NOTE — Telephone Encounter (Signed)
Pt c/o sharp abdominal pain that extends to back, Toradol does relieve the pain, hx of Endometriosis. Pt states the pain was so severe if she had not had the Toradol for pain she would have needed to go to ER. Pt given an appt with Dr.Eure for evaluation.

## 2016-04-01 ENCOUNTER — Ambulatory Visit (INDEPENDENT_AMBULATORY_CARE_PROVIDER_SITE_OTHER): Payer: BLUE CROSS/BLUE SHIELD | Admitting: Obstetrics & Gynecology

## 2016-04-01 ENCOUNTER — Other Ambulatory Visit (INDEPENDENT_AMBULATORY_CARE_PROVIDER_SITE_OTHER): Payer: BLUE CROSS/BLUE SHIELD

## 2016-04-01 ENCOUNTER — Other Ambulatory Visit: Payer: Self-pay | Admitting: Obstetrics & Gynecology

## 2016-04-01 ENCOUNTER — Encounter: Payer: Self-pay | Admitting: Obstetrics & Gynecology

## 2016-04-01 VITALS — BP 126/64 | HR 64 | Ht 64.0 in | Wt 197.4 lb

## 2016-04-01 DIAGNOSIS — N809 Endometriosis, unspecified: Secondary | ICD-10-CM

## 2016-04-01 DIAGNOSIS — N80129 Deep endometriosis of ovary, unspecified ovary: Secondary | ICD-10-CM

## 2016-04-01 DIAGNOSIS — R102 Pelvic and perineal pain unspecified side: Secondary | ICD-10-CM

## 2016-04-01 DIAGNOSIS — N7011 Chronic salpingitis: Secondary | ICD-10-CM | POA: Diagnosis not present

## 2016-04-01 DIAGNOSIS — N854 Malposition of uterus: Secondary | ICD-10-CM

## 2016-04-01 MED ORDER — HYDROCODONE-ACETAMINOPHEN 5-325 MG PO TABS: 1.0000 | ORAL_TABLET | Freq: Four times a day (QID) | ORAL | 0 refills | Status: DC | PRN

## 2016-04-01 MED ORDER — DESOGESTREL-ETHINYL ESTRADIOL 0.15-30 MG-MCG PO TABS: 1.0000 | ORAL_TABLET | Freq: Every day | ORAL | 15 refills | Status: DC

## 2016-04-01 MED ORDER — KETOROLAC TROMETHAMINE 10 MG PO TABS: 10.0000 mg | ORAL_TABLET | Freq: Three times a day (TID) | ORAL | 3 refills | Status: DC | PRN

## 2016-04-01 NOTE — Progress Notes (Signed)
Follow up appointment for results  Chief Complaint  Patient presents with  . severe abdominal pain    Blood pressure 126/64, pulse 64, height 5\' 4"  (1.626 m), weight 197 lb 6.4 oz (89.5 kg).  Pt presented complaining of a week of excruciating pain much like took her to the hospital in December 2016 with what turned out to be endometriomas bilaterally and severe endometriosis of the peritoneum  Beginning 9/24/lasting for 1 week severe lower pelvic pain with nausea and vomiting, continued spotting Her lupron injected would have "run out" in July and then we switched her to lo lo estrin ocp for management She had not really had any problems significantly since her surgery until now  Sonogram below:   US Transvaginal Non-ob  Result Date: 04/01/2016 GYNECOLOGIC SONOGRAM Selena Mann is a 30 y.o. G1P0010 LMP 03/22/2016 for a pelvic sonogram for pelvic pain. Uterus                      7.1 x 4.9 x 3.1 cm, homogeneous anteverted uterus,wnl Endometrium          8 mm, symmetrical, wnl Right ovary             2.9 x 1.6 x 3 cm, wnl Left ovary                3.7 x 3.2 x 2.1 cm, dominate simple follicle 2.4  X 2.5 x 1.5 cm Bilat hydrosalpinx w/debris,no free fluid,no pain during ultrasound,ov's appear mobile Technician Comments: PELVIC US TA/TV:homogeneous anteverted uterus wnl,EEC 8 mm,normal ov's bilat,dominate simple follicle lt ov 2.4 cm x 2.5 x 1.5 cm,bilat hydrosalpinx w/debris left 4.4 x .9cm,right 3.5 x .9 cm Amber J Carl 04/01/2016 12:30 PM Clinical Impression and recommendations: I have reviewed the sonogram results above, combined with the patient's current clinical course, below are my impressions and any appropriate recommendations for management based on the sonographic findings. Uterus and endometrium are normal Both ovaries are normal with physiologic cyst left ovary(on lo lo estrin) Small amount of fluid in both ovaries, clinically hydrosalpinx No endometriomas are present Shalane Florendo H  04/01/2016 3:17 PM   US Pelvis Complete  Result Date: 04/01/2016 GYNECOLOGIC SONOGRAM Selena Mann is a 30 y.o. G1P0010 LMP 03/22/2016 for a pelvic sonogram for pelvic pain. Uterus                      7.1 x 4.9 x 3.1 cm, homogeneous anteverted uterus,wnl Endometrium          8 mm, symmetrical, wnl Right ovary             2.9 x 1.6 x 3 cm, wnl Left ovary                3.7 x 3.2 x 2.1 cm, dominate simple follicle 2.4  X 2.5 x 1.5 cm Bilat hydrosalpinx w/debris,no free fluid,no pain during ultrasound,ov's appear mobile Technician Comments: PELVIC US TA/TV:homogeneous anteverted uterus wnl,EEC 8 mm,normal ov's bilat,dominate simple follicle lt ov 2.4 cm x 2.5 x 1.5 cm,bilat hydrosalpinx w/debris left 4.4 x .9cm,right 3.5 x .9 cm Amber J Carl 04/01/2016 12:30 PM Clinical Impression and recommendations: I have reviewed the sonogram results above, combined with the patient's current clinical course, below are my impressions and any appropriate recommendations for management based on the sonographic findings. Uterus and endometrium are normal Both ovaries are normal with physiologic cyst left ovary(on lo lo estrin) Small amount of fluid in both ovaries,  clinically hydrosalpinx No endometriomas are present Maverik Foot H 04/01/2016 3:17 PM      MEDS ordered this encounter: Meds ordered this encounter  Medications  . TRINTELLIX 20 MG TABS    Sig: Take 20 mg by mouth daily.     Refill:  0  . desogestrel-ethinyl estradiol (APRI,EMOQUETTE,SOLIA) 0.15-30 MG-MCG tablet    Sig: Take 1 tablet by mouth daily.    Dispense:  1 Package    Refill:  15    Pt has severe endometriosis and I want her to take the pills continuously, no off pills to be taken so she will need a new pack every 21 days  . ketorolac (TORADOL) 10 MG tablet    Sig: Take 1 tablet (10 mg total) by mouth every 8 (eight) hours as needed.    Dispense:  15 tablet    Refill:  3  . DISCONTD: HYDROcodone-acetaminophen (NORCO/VICODIN) 5-325 MG tablet     Sig: Take 1 tablet by mouth every 6 (six) hours as needed.    Dispense:  30 tablet    Refill:  0  . HYDROcodone-acetaminophen (NORCO/VICODIN) 5-325 MG tablet    Sig: Take 1 tablet by mouth every 6 (six) hours as needed.    Dispense:  30 tablet    Refill:  0    Orders for this encounter: No orders of the defined types were placed in this encounter.   Impression: 1. Endometriosis, severe   2. Pelvic pain in female    Plan: Will switch pill to something a little stronger to control spotitng, 30 mic/desogestrel, to take continuously for 3 months at a time then a week off to see if that manages her cycle toradol and lortab on hand if needed for severe episodes of pain  Message/call me in the meantime if any problmes arise  Follow Up: Return in about 6 months (around 09/30/2016) for Follow up, with Dr Despina Hidden.       Face to face time:  15 minutes  Greater than 50% of the visit time was spent in counseling and coordination of care with the patient.  The summary and outline of the counseling and care coordination is summarized in the note above.   All questions were answered.  Past Medical History:  Diagnosis Date  . Depression   . Endometriosis     Past Surgical History:  Procedure Laterality Date  . EXCISION OF ENDOMETRIOMA    . LAPAROTOMY N/A 07/04/2015   Procedure: EXPLORATORY LAPAROTOMY, REMOVAL OF BILATERAL OVARIAN ENDOMETRIOMAS;  Surgeon: Lazaro Arms, MD;  Location: [facility];  Service: Gynecology;  Laterality: N/A;  . TONSILLECTOMY      OB History    Gravida Para Term Preterm AB Living   1 0 0 0 1 0   SAB TAB Ectopic Multiple Live Births   1 0 0 0        No Known Allergies  Social History   Social History  . Marital status: Single    Spouse name: N/A  . Number of children: N/A  . Years of education: N/A   Social History Main Topics  . Smoking status: Former Smoker    Years: 4.00    Types: Cigarettes  . Smokeless tobacco: Never Used  . Alcohol use  Yes     Comment: occasional  . Drug use: No  . Sexual activity: Yes    Birth control/ protection: None     Comment: female partner   Other Topics Concern  . None  Social History Narrative  . None    Family History  Problem Relation Age of Onset  . Anxiety disorder Mother   . Asthma Mother   . Other Father     heart defect  . Hypertension Father   . Hyperlipidemia Father   . Diabetes Father     pre-diabetes  . Other Brother     heart defect  . Diabetes Maternal Grandmother   . Hyperthyroidism Maternal Grandmother     hypothyroid after surgery  . Cancer Maternal Grandfather   . Diabetes Paternal Grandmother   . Other Brother     heart defect

## 2016-04-01 NOTE — Progress Notes (Signed)
PELVIC US TA/TV:homogeneous anteverted uterus wnl,EEC 8 mm,normal ov's bilat,dominate simple follicle lt ov 2.4 cm x 2.5 x 1.5 cm,bilat hydrosalpinx w/debris left 4.4 x .9cm,right 3.5 x .9 cm

## 2016-04-03 ENCOUNTER — Ambulatory Visit: Payer: Self-pay | Admitting: Obstetrics & Gynecology

## 2016-06-16 ENCOUNTER — Ambulatory Visit: Payer: BLUE CROSS/BLUE SHIELD | Admitting: Obstetrics & Gynecology

## 2016-06-18 ENCOUNTER — Ambulatory Visit: Payer: BLUE CROSS/BLUE SHIELD | Admitting: Obstetrics & Gynecology

## 2016-06-25 ENCOUNTER — Ambulatory Visit: Payer: BLUE CROSS/BLUE SHIELD | Admitting: Obstetrics & Gynecology

## 2016-09-15 ENCOUNTER — Ambulatory Visit (INDEPENDENT_AMBULATORY_CARE_PROVIDER_SITE_OTHER): Payer: BLUE CROSS/BLUE SHIELD | Admitting: Obstetrics & Gynecology

## 2016-09-15 ENCOUNTER — Encounter: Payer: Self-pay | Admitting: Obstetrics & Gynecology

## 2016-09-15 VITALS — BP 102/74 | HR 77 | Wt 184.0 lb

## 2016-09-15 DIAGNOSIS — N809 Endometriosis, unspecified: Secondary | ICD-10-CM | POA: Diagnosis not present

## 2016-09-15 MED ORDER — KETOROLAC TROMETHAMINE 10 MG PO TABS: 10.0000 mg | ORAL_TABLET | Freq: Three times a day (TID) | ORAL | 3 refills | Status: DC | PRN

## 2016-09-15 MED ORDER — ONDANSETRON 8 MG PO TBDP: 8.0000 mg | ORAL_TABLET | Freq: Three times a day (TID) | ORAL | 1 refills | Status: DC | PRN

## 2016-09-15 NOTE — Progress Notes (Signed)
Chief Complaint  Patient presents with  . Follow-up    endometriosis    Blood pressure 102/74, pulse 77, weight 184 lb (83.5 kg).  30 y.o. G1P0010 No LMP recorded. Patient is not currently having periods (Reason: Oral contraceptives). The current method of family planning is OCP (estrogen/progesterone).  Outpatient Encounter Prescriptions as of 09/15/2016  Medication Sig Note  . buPROPion (WELLBUTRIN XL) 150 MG 24 hr tablet Take 150 mg by mouth daily.   Marland Kitchen desogestrel-ethinyl estradiol (APRI,EMOQUETTE,SOLIA) 0.15-30 MG-MCG tablet Take 1 tablet by mouth daily.   Marland Kitchen escitalopram (LEXAPRO) 20 MG tablet Take 20 mg by mouth daily.   Marland Kitchen ketorolac (TORADOL) 10 MG tablet Take 1 tablet (10 mg total) by mouth every 8 (eight) hours as needed.   . loratadine (CLARITIN) 10 MG tablet Take 10 mg by mouth daily as needed. Reported on 07/16/2015   . Multiple Vitamin (MULTIVITAMIN WITH MINERALS) TABS tablet Take 1 tablet by mouth daily.   . Omega-3 Fatty Acids (FISH OIL) 1000 MG CAPS Take 1,000 mg by mouth daily.   . ondansetron (ZOFRAN ODT) 8 MG disintegrating tablet Take 1 tablet (8 mg total) by mouth every 8 (eight) hours as needed for nausea or vomiting.   . [DISCONTINUED] ketorolac (TORADOL) 10 MG tablet Take 1 tablet (10 mg total) by mouth every 8 (eight) hours as needed.   . [DISCONTINUED] ondansetron (ZOFRAN ODT) 8 MG disintegrating tablet Take 1 tablet (8 mg total) by mouth every 8 (eight) hours as needed for nausea or vomiting.   Marland Kitchen HYDROcodone-acetaminophen (NORCO/VICODIN) 5-325 MG tablet Take 1 tablet by mouth every 6 (six) hours as needed. (Patient not taking: Reported on 09/15/2016)   . [DISCONTINUED] Norethindrone-Ethinyl Estradiol-Fe Biphas (LO LOESTRIN FE) 1 MG-10 MCG / 10 MCG tablet Take 1 tablet by mouth daily. Take 1 daily, continuously, no "off" pills because patient has severe endometriosis (Patient not taking: Reported on 09/15/2016)   . [DISCONTINUED] TRINTELLIX 20 MG TABS Take 20 mg  by mouth daily.  04/01/2016: Received from: External Pharmacy   No facility-administered encounter medications on file as of 09/15/2016.     Subjective Pt is seen in for follow up of her endometriosis She is s.p exploratory laparotomy with removal of bilateral endometriomas and resection of endometriosis 07/04/2015, s/p post op lupron for 6 months, maintained on OCP now for 6-8 months She was on the lo lo estrin which was found to be too low estrogen So she in on desogestrel with 30 mics of EE monophasic, takes the placebo week only either 3rd month  She has pain about 7 days a month of course worse on her "off" month Her max pain is now about 5 on those days as opposed to 8 or 9 prior to her nowing about the endometriosis   Objective Abdomen soft non distended non tender, well healed incision Bimanual Normal vagina no masses non tender Cervix is normal no CMT No adnexal or midline masses or tenderness ovaries feel normal at this point  Pertinent ROS No burning with urination, frequency or urgency No nausea, vomiting or diarrhea Nor fever chills or other constitutional symptoms   Labs or studies     Impression Diagnoses this Encounter::   ICD-9-CM ICD-10-CM   1. Endometriosis, severe 617.9 N80.9     Established relevant diagnosis(es):   Plan/Recommendations: Meds ordered this encounter  Medications  . escitalopram (LEXAPRO) 20 MG tablet    Sig: Take 20 mg by mouth daily.  Marland Kitchen buPROPion [facility]  XL) 150 MG 24 hr tablet    Sig: Take 150 mg by mouth daily.  Marland Kitchen. ketorolac (TORADOL) 10 MG tablet    Sig: Take 1 tablet (10 mg total) by mouth every 8 (eight) hours as needed.    Dispense:  15 tablet    Refill:  3  . ondansetron (ZOFRAN ODT) 8 MG disintegrating tablet    Sig: Take 1 tablet (8 mg total) by mouth every 8 (eight) hours as needed for nausea or vomiting.    Dispense:  20 tablet    Refill:  1    Labs or Scans Ordered: No orders of the defined types were placed  in this encounter.   Management:: Continue the semi continuous OCP desogestrel 30 mics EE Continue toradol and zofran prn  Follow up Return in about 6 months (around 03/18/2017) for Follow up, with Dr Despina HiddenEure.       All questions were answered.  Past Medical History:  Diagnosis Date  . Depression   . Endometriosis     Past Surgical History:  Procedure Laterality Date  . EXCISION OF ENDOMETRIOMA    . LAPAROTOMY N/A 07/04/2015   Procedure: EXPLORATORY LAPAROTOMY, REMOVAL OF BILATERAL OVARIAN ENDOMETRIOMAS;  Surgeon: Lazaro ArmsLuther H Velicia Dejager, MD;  Location: [facility];  Service: Gynecology;  Laterality: N/A;  . TONSILLECTOMY      OB History    Gravida Para Term Preterm AB Living   1 0 0 0 1 0   SAB TAB Ectopic Multiple Live Births   1 0 0 0        No Known Allergies  Social History   Social History  . Marital status: Single    Spouse name: N/A  . Number of children: N/A  . Years of education: N/A   Social History Main Topics  . Smoking status: Former Smoker    Years: 4.00    Types: Cigarettes  . Smokeless tobacco: Never Used  . Alcohol use Yes     Comment: occasional  . Drug use: No  . Sexual activity: Yes    Birth control/ protection: None     Comment: female partner   Other Topics Concern  . None   Social History Narrative  . None    Family History  Problem Relation Age of Onset  . Anxiety disorder Mother   . Asthma Mother   . Other Father     heart defect  . Hypertension Father   . Hyperlipidemia Father   . Diabetes Father     pre-diabetes  . Other Brother     heart defect  . Diabetes Maternal Grandmother   . Hyperthyroidism Maternal Grandmother     hypothyroid after surgery  . Cancer Maternal Grandfather   . Diabetes Paternal Grandmother   . Other Brother     heart defect

## 2016-10-09 ENCOUNTER — Other Ambulatory Visit: Payer: Self-pay | Admitting: Obstetrics & Gynecology

## 2016-10-09 ENCOUNTER — Encounter: Payer: Self-pay | Admitting: Obstetrics & Gynecology

## 2016-10-09 MED ORDER — MEGESTROL ACETATE 40 MG PO TABS: ORAL_TABLET | ORAL | 3 refills | Status: DC

## 2016-11-20 ENCOUNTER — Other Ambulatory Visit: Payer: Self-pay | Admitting: Obstetrics & Gynecology

## 2016-11-20 ENCOUNTER — Encounter: Payer: Self-pay | Admitting: Obstetrics & Gynecology

## 2016-11-20 MED ORDER — MEGESTROL ACETATE 40 MG PO TABS: ORAL_TABLET | ORAL | 11 refills | Status: DC

## 2017-01-06 ENCOUNTER — Encounter: Payer: Self-pay | Admitting: Obstetrics & Gynecology

## 2017-01-06 ENCOUNTER — Ambulatory Visit (INDEPENDENT_AMBULATORY_CARE_PROVIDER_SITE_OTHER): Payer: BLUE CROSS/BLUE SHIELD | Admitting: Obstetrics & Gynecology

## 2017-01-06 VITALS — BP 102/78 | HR 92 | Wt 171.0 lb

## 2017-01-06 DIAGNOSIS — N809 Endometriosis, unspecified: Secondary | ICD-10-CM

## 2017-01-06 DIAGNOSIS — N938 Other specified abnormal uterine and vaginal bleeding: Secondary | ICD-10-CM

## 2017-01-06 DIAGNOSIS — N946 Dysmenorrhea, unspecified: Secondary | ICD-10-CM | POA: Diagnosis not present

## 2017-01-07 NOTE — Progress Notes (Signed)
Chief Complaint  Patient presents with  . Pre-op Exam    Blood pressure 102/78, pulse 92, weight 171 lb (77.6 kg).  31 y.o. G1P0010 No LMP recorded. Patient is not currently having periods (Reason: Oral contraceptives). The current method of family planning is oral progesterone-only contraceptive.  Outpatient Encounter Prescriptions as of 01/06/2017  Medication Sig  . buPROPion (WELLBUTRIN XL) 150 MG 24 hr tablet Take 150 mg by mouth daily.  Marland Kitchen. escitalopram (LEXAPRO) 20 MG tablet Take 20 mg by mouth daily.  Marland Kitchen. loratadine (CLARITIN) 10 MG tablet Take 10 mg by mouth daily as needed. Reported on 07/16/2015  . megestrol (MEGACE) 40 MG tablet 3 tablets a day for 5 days, 2 tablets a day for 5 days then 1 tablet daily  . Multiple Vitamin (MULTIVITAMIN WITH MINERALS) TABS tablet Take 1 tablet by mouth daily.  . Omega-3 Fatty Acids (FISH OIL) 1000 MG CAPS Take 1,000 mg by mouth daily.  Marland Kitchen. desogestrel-ethinyl estradiol (APRI,EMOQUETTE,SOLIA) 0.15-30 MG-MCG tablet Take 1 tablet by mouth daily. (Patient not taking: Reported on 01/06/2017)  . HYDROcodone-acetaminophen (NORCO/VICODIN) 5-325 MG tablet Take 1 tablet by mouth every 6 (six) hours as needed. (Patient not taking: Reported on 09/15/2016)  . ketorolac (TORADOL) 10 MG tablet Take 1 tablet (10 mg total) by mouth every 8 (eight) hours as needed. (Patient not taking: Reported on 01/06/2017)  . [DISCONTINUED] megestrol (MEGACE) 40 MG tablet Take 3 daily (Patient not taking: Reported on 01/06/2017)  . [DISCONTINUED] ondansetron (ZOFRAN ODT) 8 MG disintegrating tablet Take 1 tablet (8 mg total) by mouth every 8 (eight) hours as needed for nausea or vomiting.   No facility-administered encounter medications on file as of 01/06/2017.     Subjective Pt was originally here today to discuss/pre op for hysteroscopy D&C endometrial ablation because of severe dysmenorrhea and DUB She of course has severe endometriosis, Hx of bilateral endometriomas, s/p  ovarian bilateral cystectomy due to endometriomas 07/04/2015, s/p lupron x 6 months on OCP/megestrol since surgery Continues to have bilateral adnexal pain chronically, and as stated above, bleeding and pain She is unsure if she wants definitive therapy at this point which would be TAH BSO vs interim balation to try to lessen the symptoms although she knows many of her symptoms will continue  He partner Larita FifeLynn was present for the entire conversation  Zollie ScaleOlivia is sort of conflicted, her life obligations at this point make it difficult to go forward with definitive surgery and so interval ablation seems attractive, but she is considering just waiting until her life will allow definitive surgery  A lot to consider and she and Larita FifeLynn will give it some deep thought and contact me with their decision  Newman PiesBall is in their court  Objective  Pertinent ROS Per HPI  Labs or studies     Impression Diagnoses this Encounter::   ICD-10-CM   1. Endometriosis, severe N80.9   2. DUB (dysfunctional uterine bleeding) N93.8   3. Dysmenorrhea N94.6     Established relevant diagnosis(es): Severe endometriosis  Plan/Recommendations: No orders of the defined types were placed in this encounter.   Labs or Scans Ordered: No orders of the defined types were placed in this encounter.   Management:: As above  Follow up Return if symptoms worsen or fail to improve.        Face to face time:  25 minutes  Greater than 50% of the visit time was spent in counseling and coordination of care with the patient.  The summary and outline of the counseling and care coordination is summarized in the note above.   All questions were answered.  Past Medical History:  Diagnosis Date  . Depression   . Endometriosis     Past Surgical History:  Procedure Laterality Date  . EXCISION OF ENDOMETRIOMA    . LAPAROTOMY N/A 07/04/2015   Procedure: EXPLORATORY LAPAROTOMY, REMOVAL OF BILATERAL OVARIAN ENDOMETRIOMAS;   Surgeon: Lazaro Arms, MD;  Location: [facility];  Service: Gynecology;  Laterality: N/A;  . TONSILLECTOMY      OB History    Gravida Para Term Preterm AB Living   1 0 0 0 1 0   SAB TAB Ectopic Multiple Live Births   1 0 0 0        No Known Allergies  Social History   Social History  . Marital status: Single    Spouse name: N/A  . Number of children: N/A  . Years of education: N/A   Social History Main Topics  . Smoking status: Former Smoker    Years: 4.00    Types: Cigarettes  . Smokeless tobacco: Never Used  . Alcohol use Yes     Comment: occasional  . Drug use: No  . Sexual activity: Yes    Birth control/ protection: None     Comment: female partner   Other Topics Concern  . None   Social History Narrative  . None    Family History  Problem Relation Age of Onset  . Anxiety disorder Mother   . Asthma Mother   . Other Father        heart defect  . Hypertension Father   . Hyperlipidemia Father   . Diabetes Father        pre-diabetes  . Other Brother        heart defect  . Diabetes Maternal Grandmother   . Hyperthyroidism Maternal Grandmother        hypothyroid after surgery  . Cancer Maternal Grandfather   . Diabetes Paternal Grandmother   . Other Brother        heart defect

## 2017-01-08 ENCOUNTER — Encounter: Payer: Self-pay | Admitting: Obstetrics & Gynecology

## 2017-01-14 ENCOUNTER — Other Ambulatory Visit (HOSPITAL_COMMUNITY): Payer: BLUE CROSS/BLUE SHIELD

## 2017-01-16 ENCOUNTER — Other Ambulatory Visit (HOSPITAL_COMMUNITY): Payer: BLUE CROSS/BLUE SHIELD

## 2017-01-21 ENCOUNTER — Ambulatory Visit (HOSPITAL_COMMUNITY)
Admission: RE | Admit: 2017-01-21 | Payer: BLUE CROSS/BLUE SHIELD | Source: Ambulatory Visit | Admitting: Obstetrics & Gynecology

## 2017-01-21 ENCOUNTER — Encounter (HOSPITAL_COMMUNITY): Admission: RE | Payer: Self-pay | Source: Ambulatory Visit

## 2017-01-21 SURGERY — DILATATION & CURETTAGE/HYSTEROSCOPY WITH NOVASURE ABLATION
Anesthesia: General

## 2017-01-29 ENCOUNTER — Encounter: Payer: BLUE CROSS/BLUE SHIELD | Admitting: Obstetrics & Gynecology

## 2017-01-30 ENCOUNTER — Encounter: Payer: BLUE CROSS/BLUE SHIELD | Admitting: Obstetrics & Gynecology

## 2017-02-27 IMAGING — CT CT ABD-PELV W/ CM
2 of 4 series · 16 of 46 positions shown, 18 images · IV contrast (OMNIPAQUE 300)
Comparison: None.

CLINICAL DATA: 29-year-old female with right lower quadrant
abdominal pain

EXAM:
CT ABDOMEN AND PELVIS WITH CONTRAST
TECHNIQUE: Multidetector CT imaging of the abdomen and pelvis was performed
using the standard protocol following bolus administration of
intravenous contrast.
CONTRAST:  100mL OMNIPAQUE IOHEXOL 300 MG/ML  SOLN

[Series 2: abd/pel with · axial · 0.74mm/px · z∈[+1077,+1487]mm · 13 of 90 slices shown, 15 images]
[im 4/90  soft-tissue]
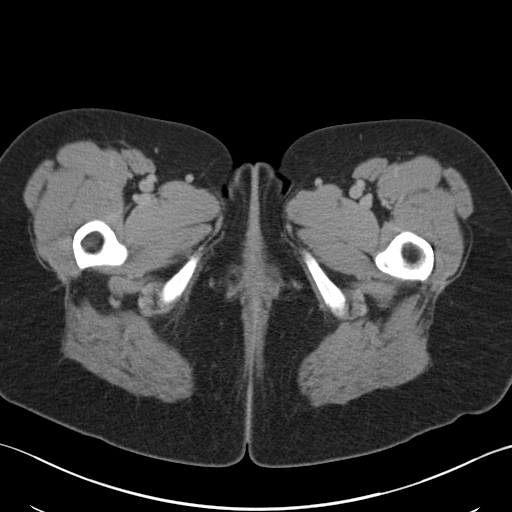
[im 4/90  bone]
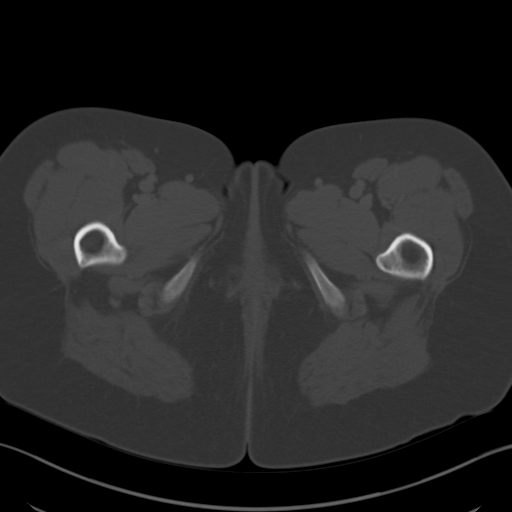
[im 12/90  soft-tissue]
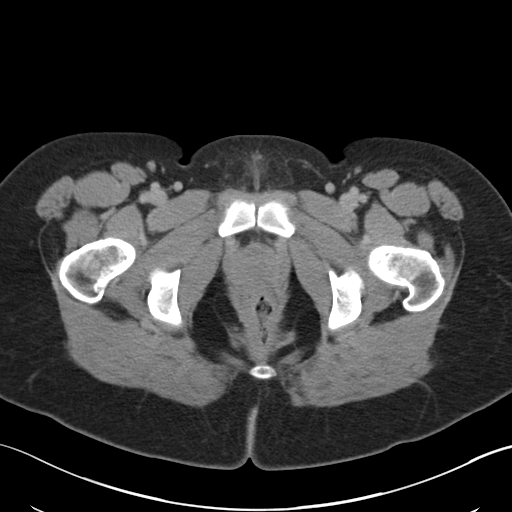
[im 20/90  soft-tissue]
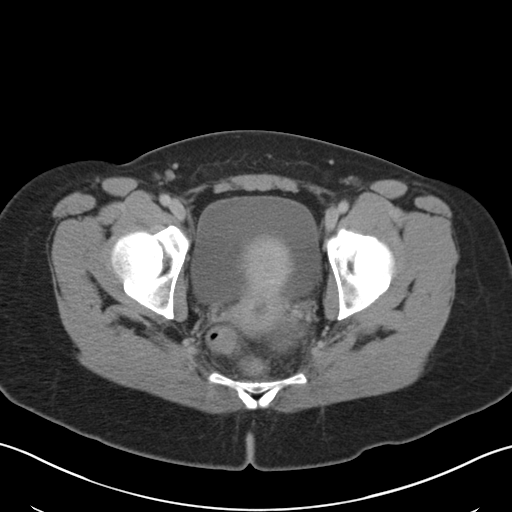
[im 24/90  soft-tissue]
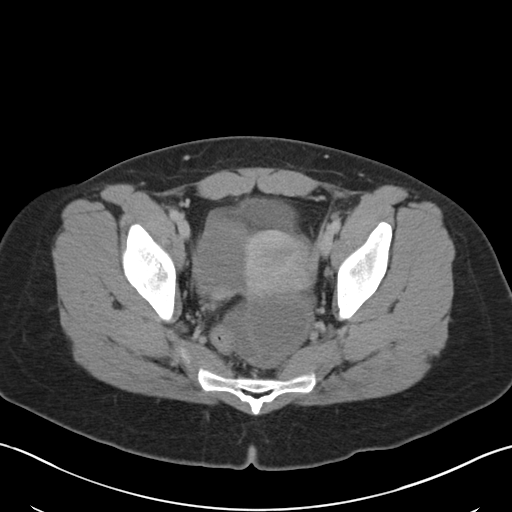
[im 31/90  soft-tissue]
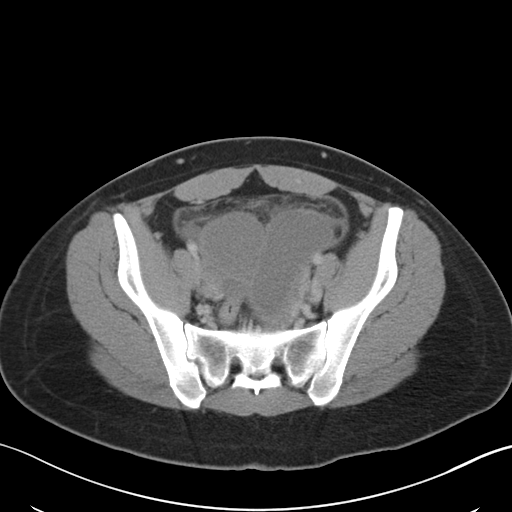
[im 39/90  soft-tissue]
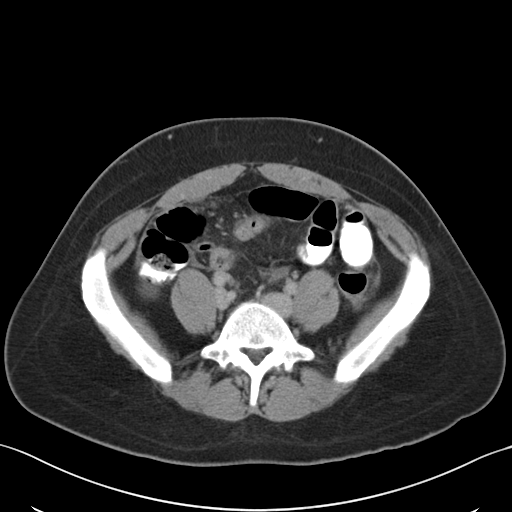
[im 47/90  soft-tissue]
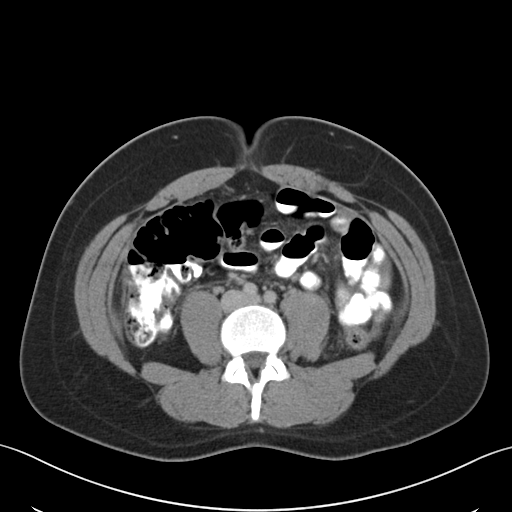
[im 51/90  soft-tissue]
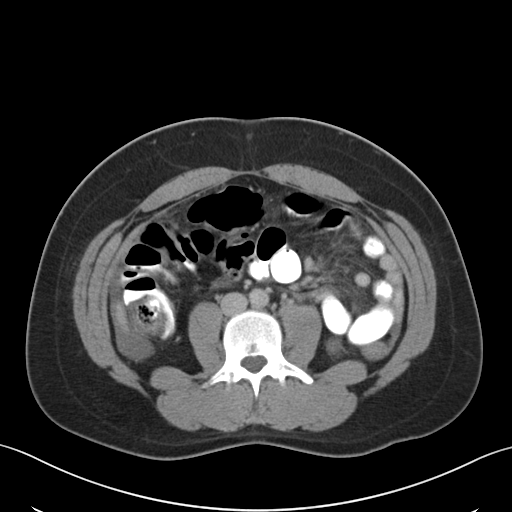
[im 59/90  soft-tissue]
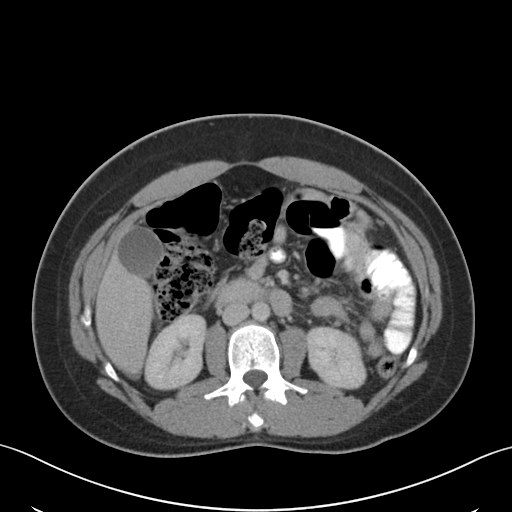
[im 59/90  bone]
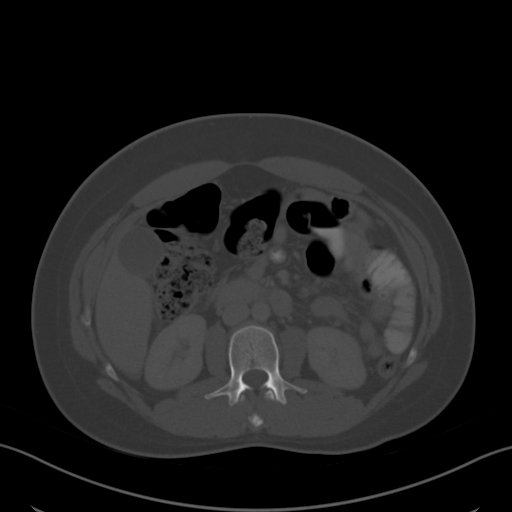
[im 66/90  soft-tissue]
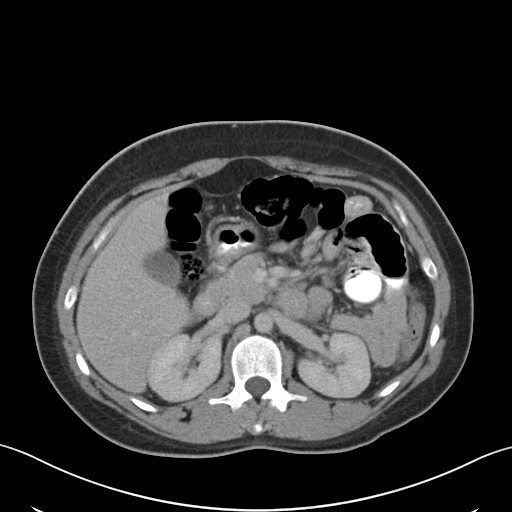
[im 70/90  soft-tissue]
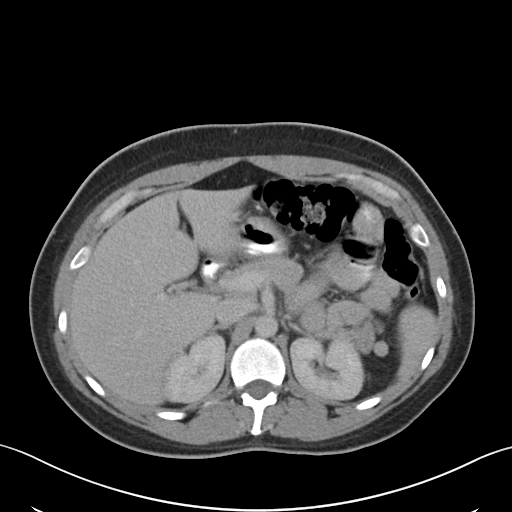
[im 78/90  soft-tissue]
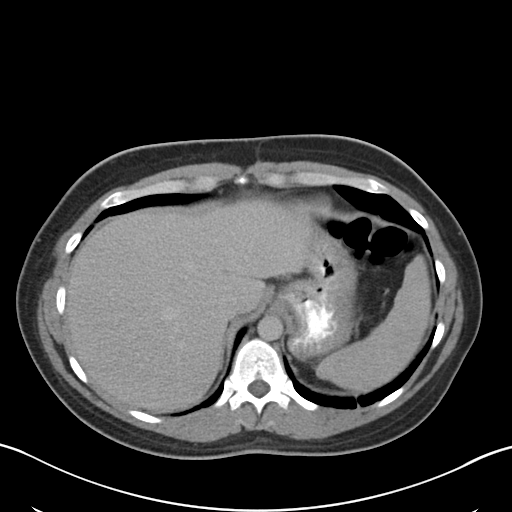
[im 86/90  soft-tissue]
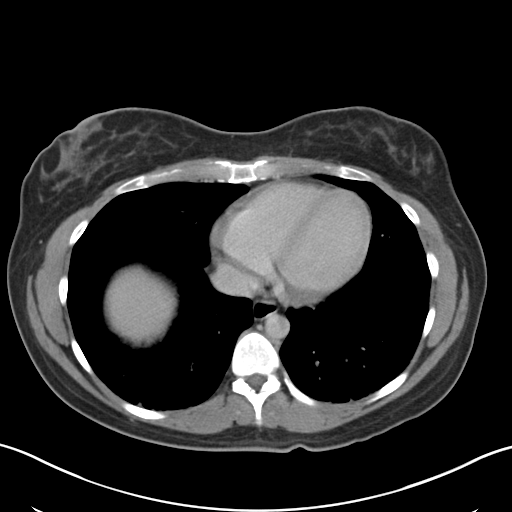

[Series 3: coronal a/|p · coronal · 0.74mm/px · 3 of 92 slices shown]
[im 31/92  soft-tissue]
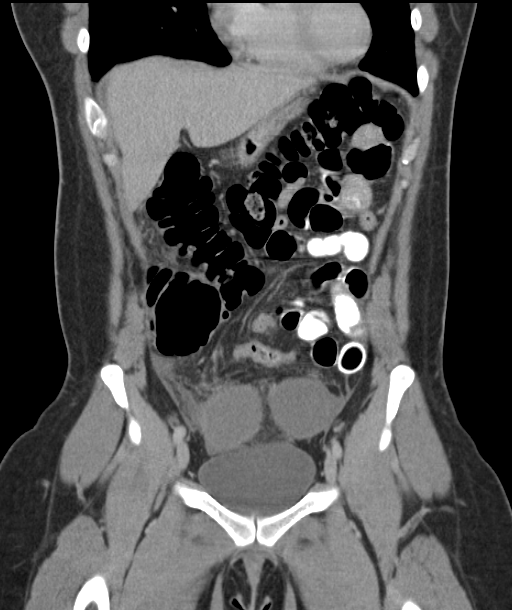
[im 41/92  soft-tissue]
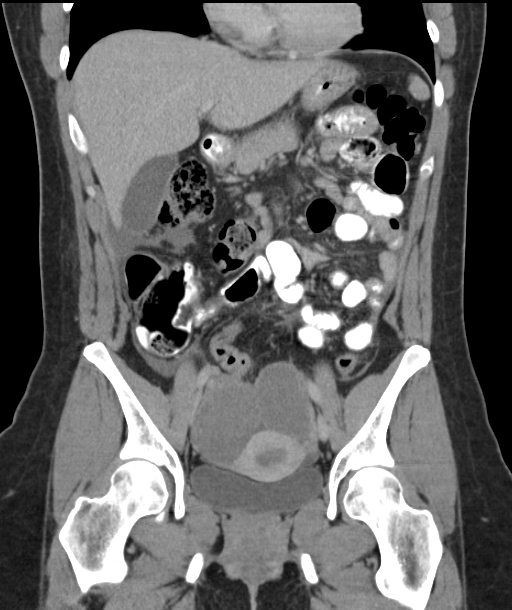
[im 51/92  soft-tissue]
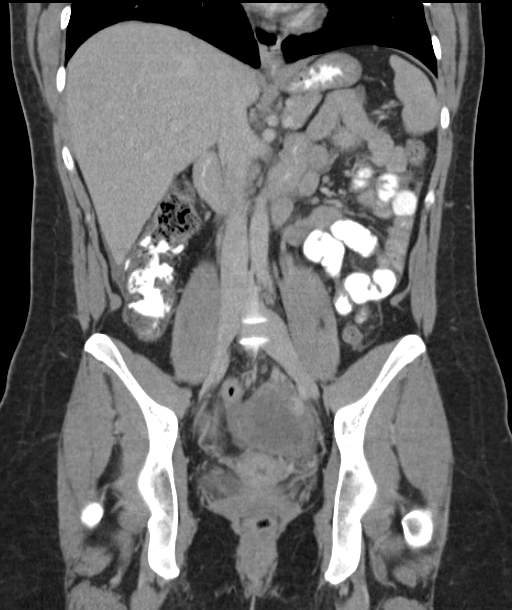

[16 of 46 positions shown; findings below may reference images not displayed]

FINDINGS: The visualized lung bases are clear. No intra-abdominal free air.
Small free fluid.

The liver, gallbladder, pancreas, spleen, adrenal glands, left
kidney, visualized ureters, and urinary bladder appear unremarkable.
There is a 3 mm nonobstructing right renal upper pole calculus. No
hydronephrosis. The uterus is anteverted. There fluid containing
structures within the pelvis presumably ovarian cyst measuring 6.1 x
5.3 cm on the right and 4.2 x 9.3 cm on the left. The left ovarian
cyst demonstrates a thickened wall with smaller adjacent cystic
lesions. There is inflammatory changes and stranding of the pelvic
floor fat with extension of the inflammatory fluid throughout the
abdomen. Findings may represent an infected complex ovarian cysts
versus less likely hydrosalpinx. Correlation with clinical exam
recommend. Pelvic ultrasound with Doppler interrogation of the
ovaries is recommended for further evaluation and exclude associated
ovarian torsion. MRI may provide additional information pending
ultrasound findings.

There is no evidence of bowel obstruction or inflammation. Normal
appendix.

The abdominal aorta and IVC appear patent. No portal venous gas
identified. There is no adenopathy.

Small fat containing umbilical hernia. The abdominal wall soft
tissues appear unremarkable. The osseous structures are intact.
IMPRESSION: Bilateral pelvic cystic masses with associated inflammatory changes.
Findings may represent complex an infected ovarian cyst versus less
likely pyosalpinx. Pelvic ultrasound with Doppler interrogation of
the ovaries is recommended for better evaluation of the cystic
lesions and documentation of flow within the ovaries. MRI may
provide additional evaluation depending on sonographic findings.

No evidence of bowel obstruction or inflammation.  Normal appendix.

## 2017-03-07 IMAGING — DX DG CHEST 2V
2 series · 2 of 2 positions shown · non-contrast
Comparison: None.

CLINICAL DATA: Fever, productive cough and shortness of breath.

EXAM:
CHEST  2 VIEW

[chest pa]
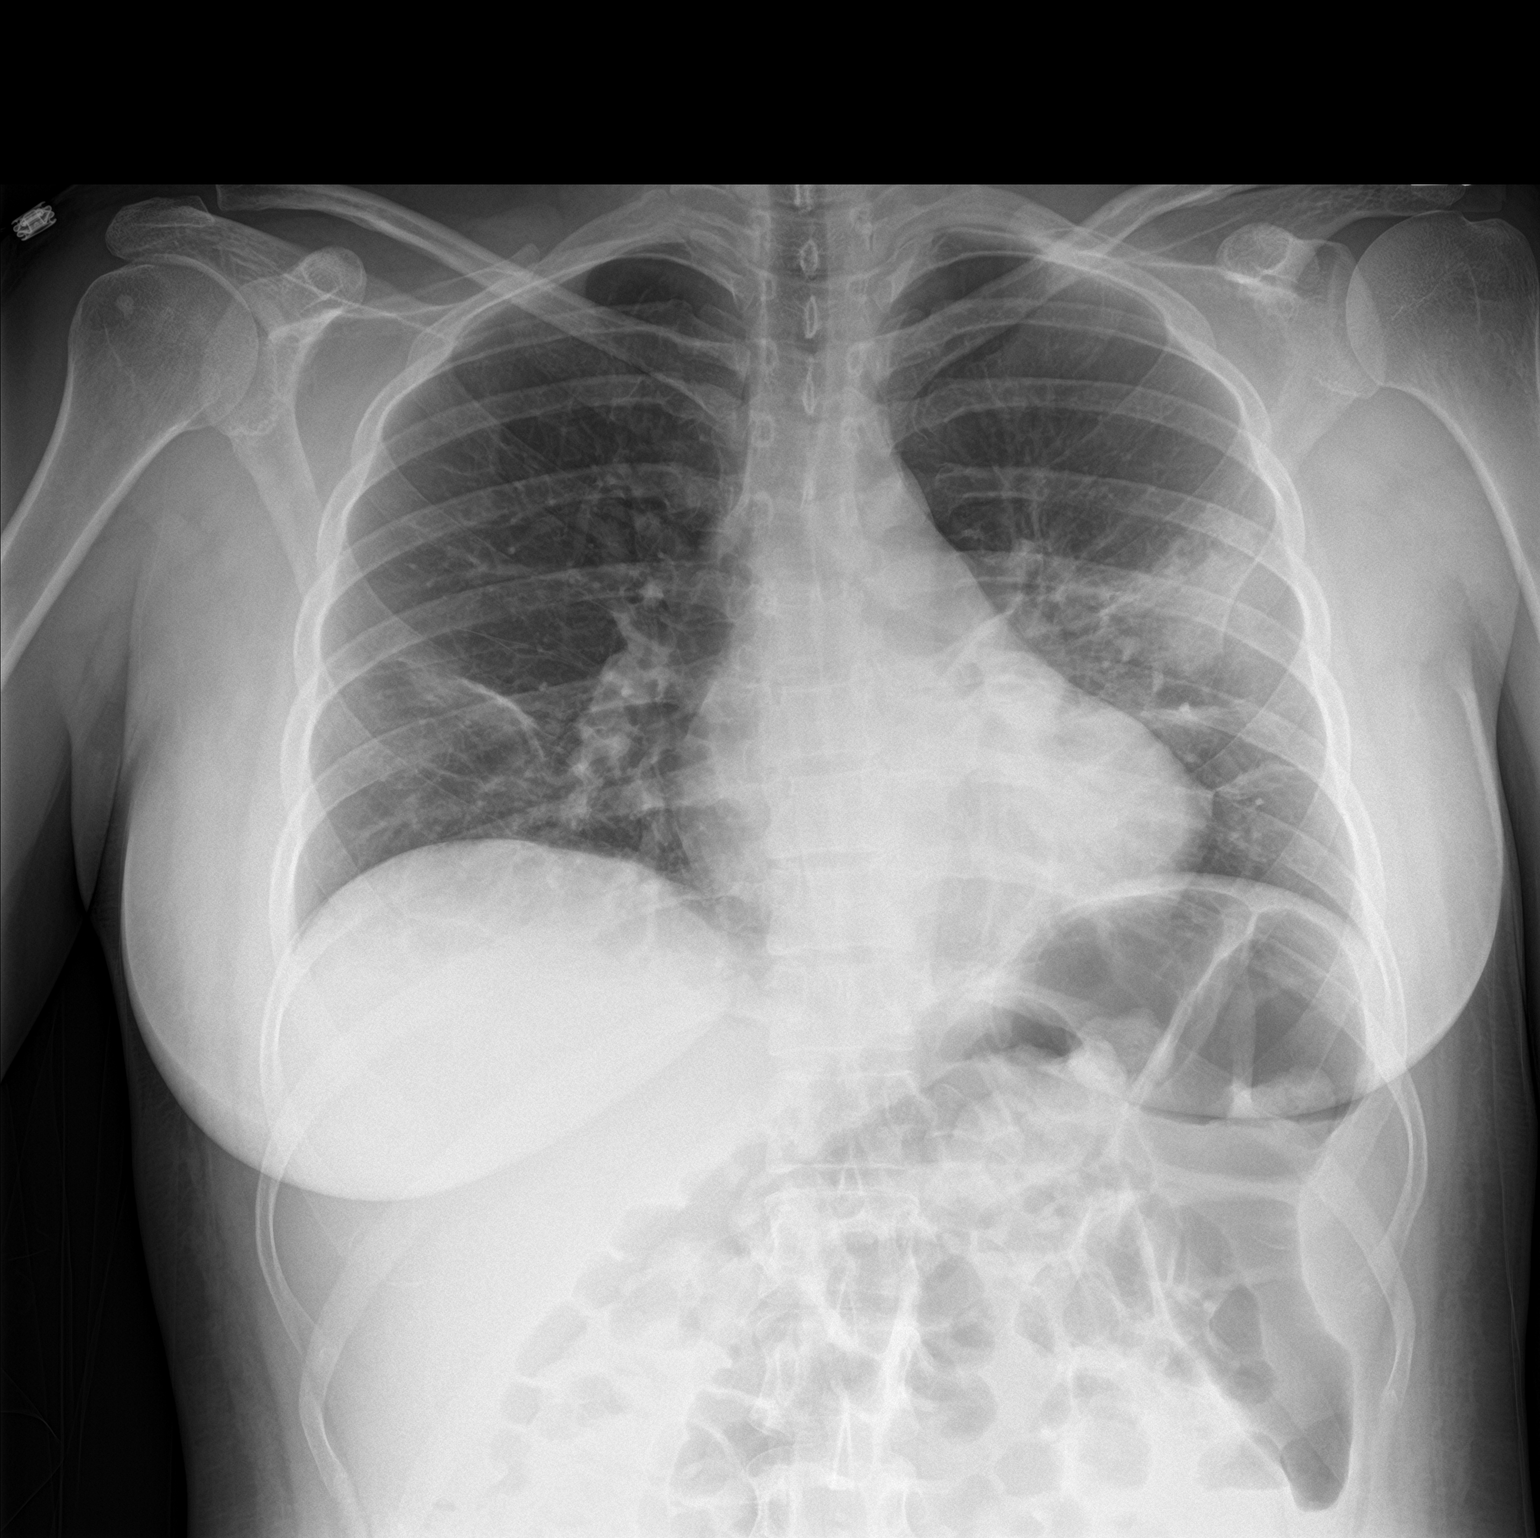

[chest lat]
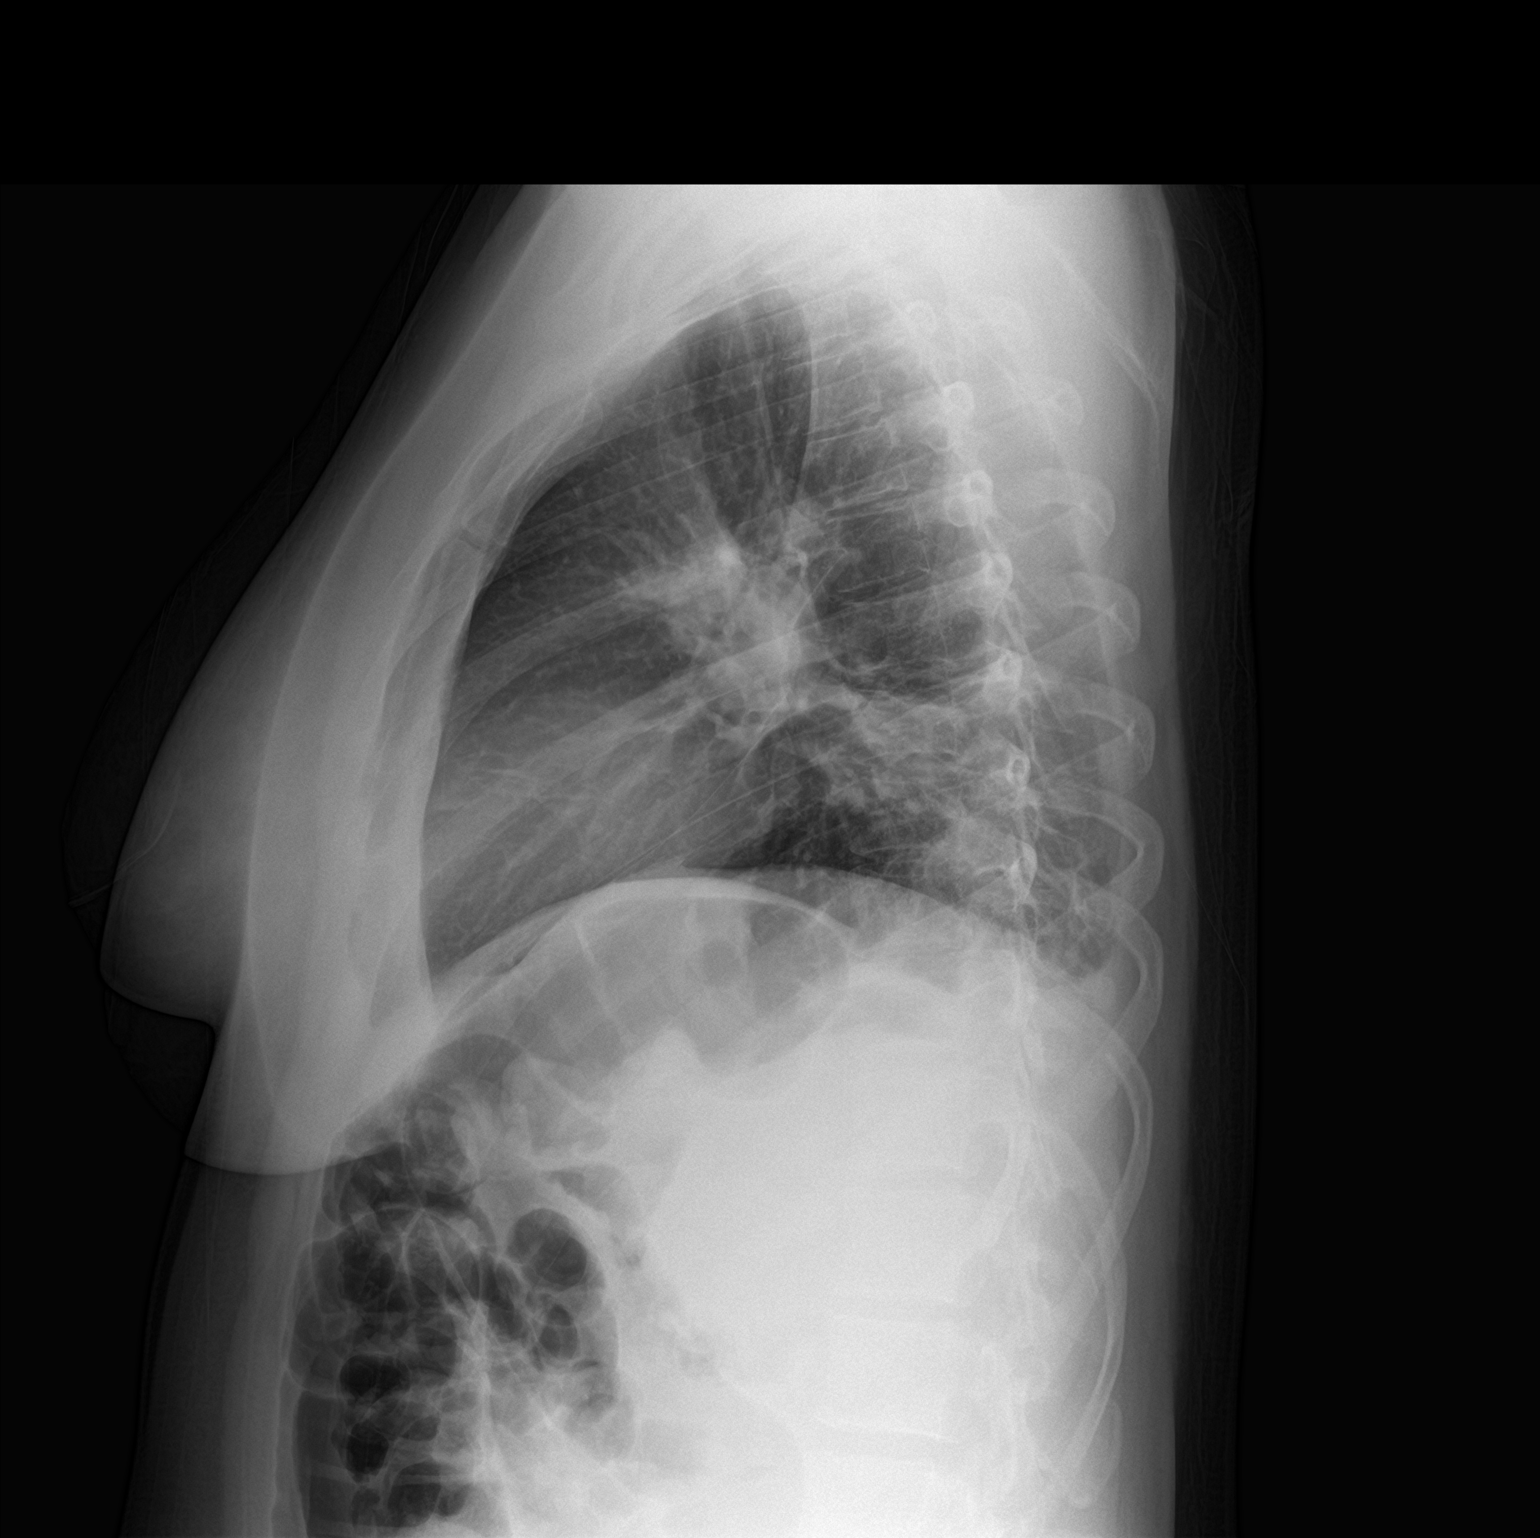

[2 of 2 positions shown; findings below may reference images not displayed]

FINDINGS: Normal heart size. Normal mediastinal contour. No pneumothorax.
Trace left pleural effusion. Patchy consolidation in the peripheral
left mid lung. Less prominent patchy opacities in the bilateral
lower lobes.
IMPRESSION: Patchy consolidation in the peripheral left midlung. Less prominent
patchy bibasilar lung opacities. Trace left pleural effusion.
Findings are most in keeping with a multilobar pneumonia. Recommend
follow-up PA and lateral post treatment chest radiographs in 6-8
weeks.

## 2017-03-30 IMAGING — US US ART/VEN ABD/PELV/SCROTUM DOPPLER LTD
1 series · 13 of 25 positions shown · non-contrast
Comparison: CT of the abdomen and pelvis performed 06/28/2015

CLINICAL DATA: Bilateral pelvic cystic masses noted on CT. Further
evaluation requested. Initial encounter.

EXAM:
TRANSABDOMINAL AND TRANSVAGINAL ULTRASOUND OF PELVIS
DOPPLER ULTRASOUND OF OVARIES
TECHNIQUE: Both transabdominal and transvaginal ultrasound examinations of the
pelvis were performed. Transabdominal technique was performed for
global imaging of the pelvis including uterus, ovaries, adnexal
regions, and pelvic cul-de-sac.
It was necessary to proceed with endovaginal exam following the
transabdominal exam to visualize the ovaries in greater detail.
Color and duplex Doppler ultrasound was utilized to evaluate blood
flow to the ovaries.

[Series 1: us art/ven abd/pelv/scrotum doppler ltd · 0.27mm/px · 13 of 68 slices shown]
[im 1/68]
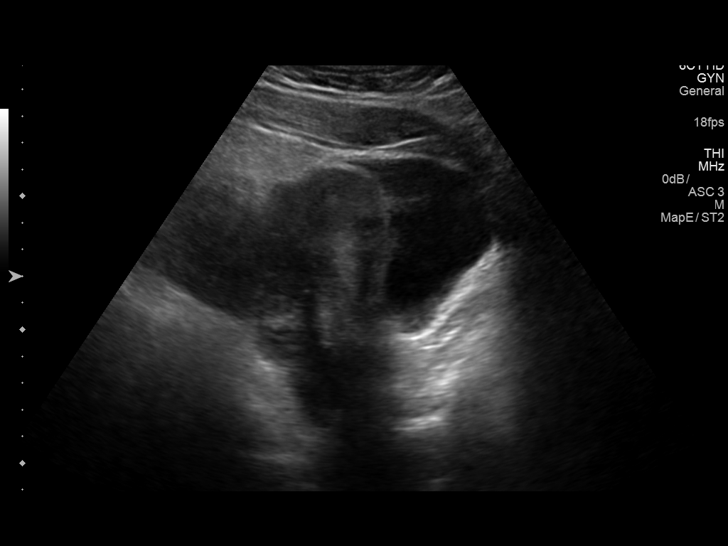
[im 6/68]
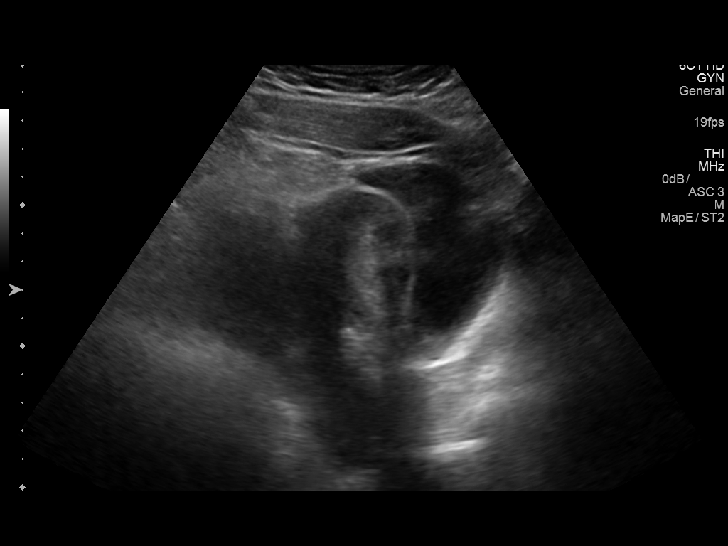
[im 12/68]
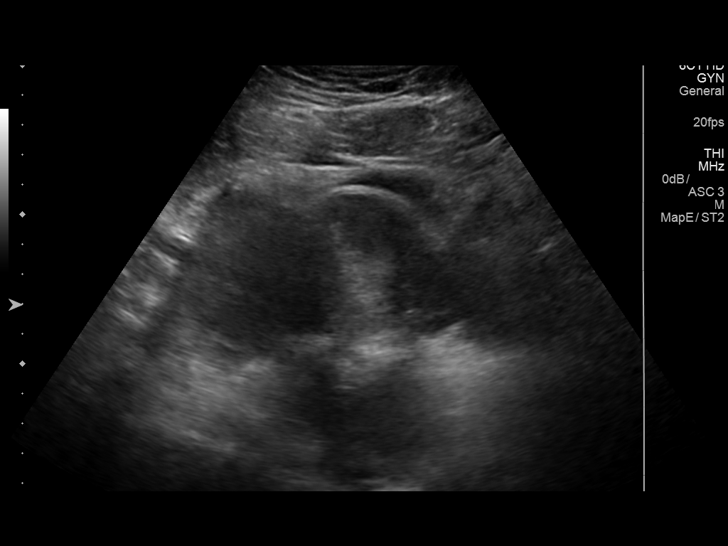
[im 17/68]
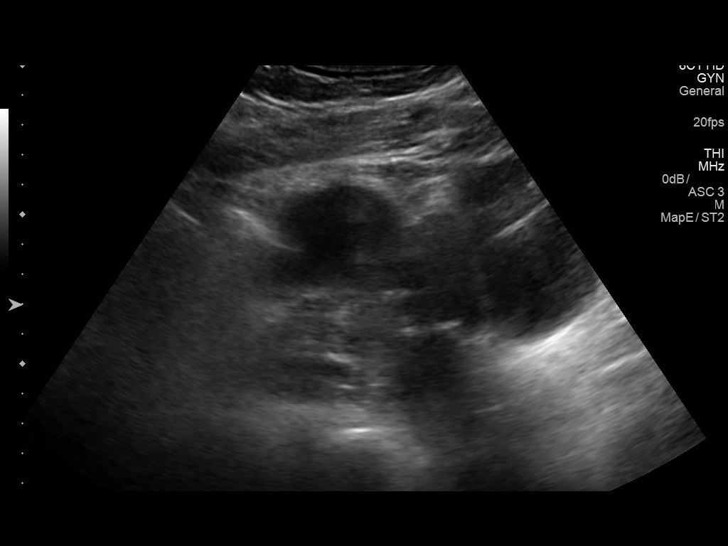
[im 23/68]
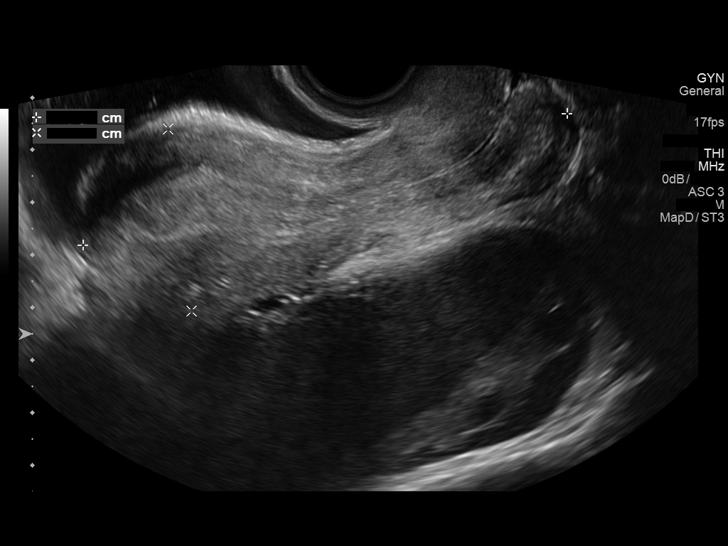
[im 28/68]
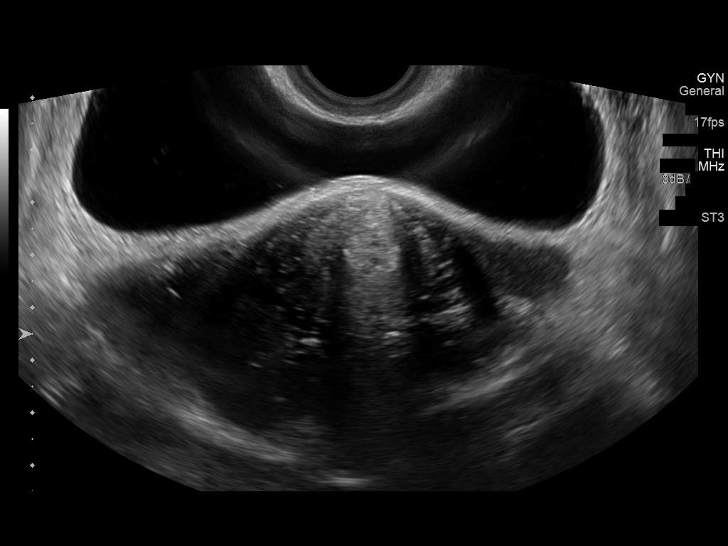
[im 34/68]
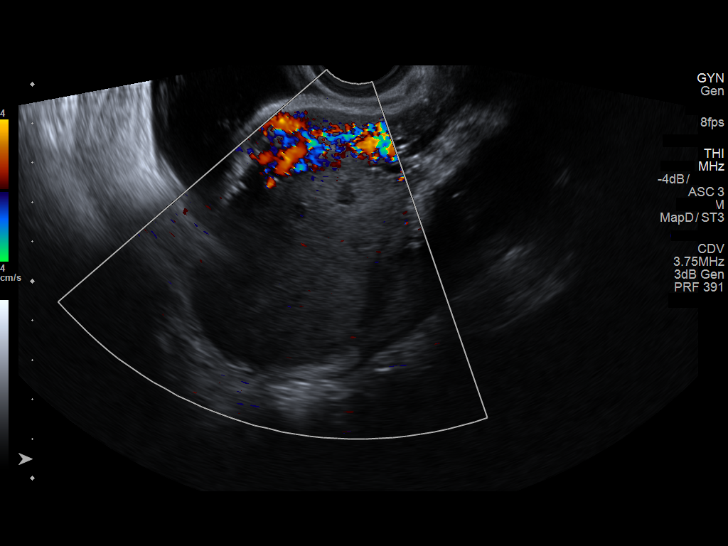
[im 40/68]
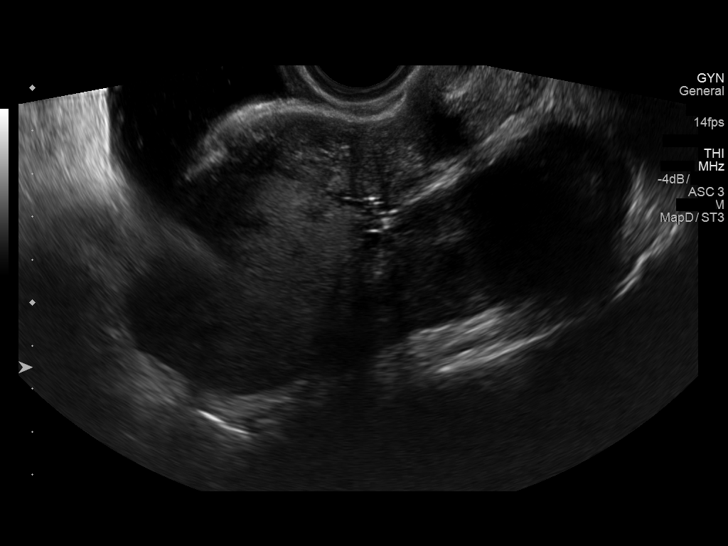
[im 45/68]
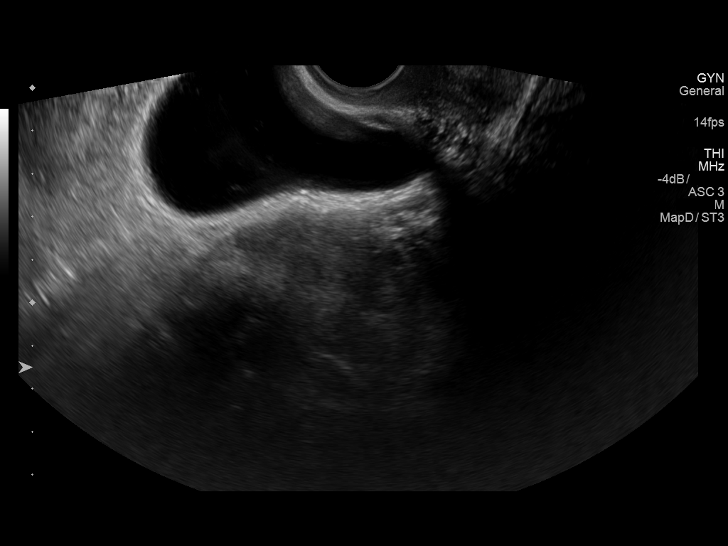
[im 51/68]
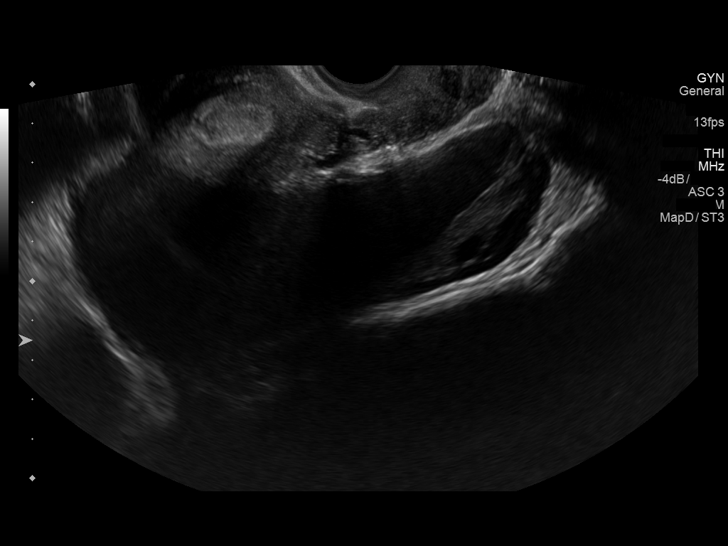
[im 56/68]
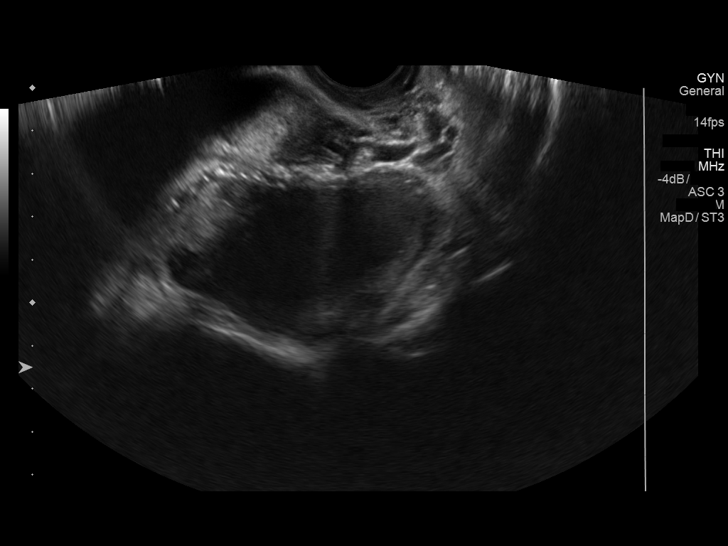
[im 62/68]
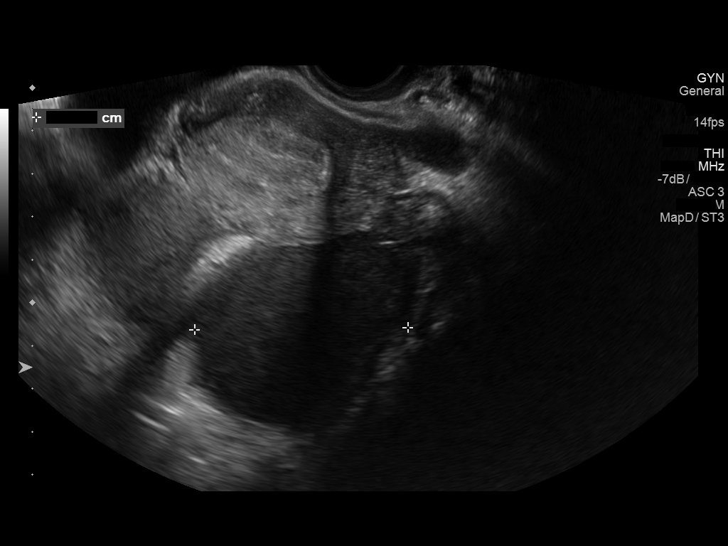
[im 68/68]
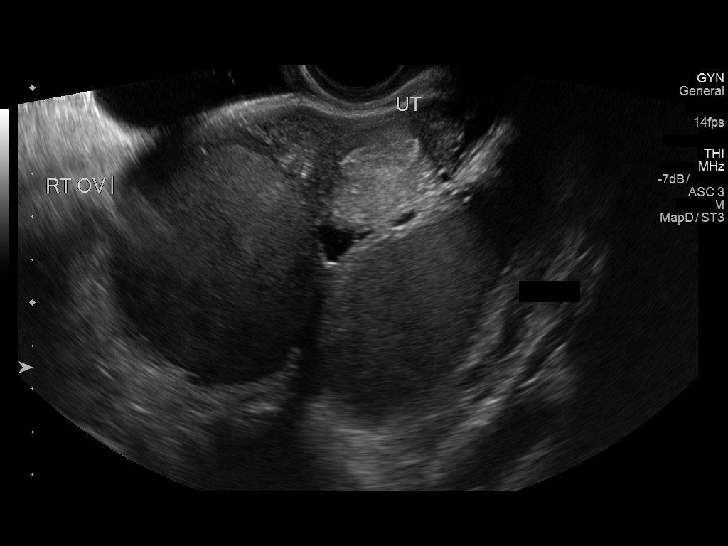

[13 of 25 positions shown; findings below may reference images not displayed]

FINDINGS: Uterus

Measurements: 9.6 x 3.5 x 4.4 cm. No fibroids or other mass
visualized.

Endometrium

Thickness: 1.2 cm.  No focal abnormality visualized.

Right ovary

Measurements: 6.3 x 5.6 x 5.3 cm. There is a large echogenic region
within the right ovary, measuring 4.4 x 4.2 x 4.2 cm, which may
reflect an endometrioma or possibly a hemorrhagic cyst.

Left ovary

Measurements: 13.0 x 3.6 x 6.1 cm. There is a larger complex cystic
lesion at the left ovary, measuring 11.9 x 3.3 x 5.0 cm, with
layering increased attenuation, most likely reflecting a large
organizing hemorrhagic cyst.

Pulsed Doppler evaluation of both ovaries demonstrates normal
low-resistance arterial and venous waveforms.

Other findings

Trace free fluid seen within the pelvic cul-de-sac, likely
physiologic in nature.
IMPRESSION: Echogenic region at the right ovary, measuring 4.4 cm, and a larger
complex cystic lesion at the left ovary, measuring 11.9 x 3.3 x
cm. The left-sided lesion reflects a large organizing hemorrhagic
cyst. The right ovarian region may reflect an endometrioma or
hemorrhagic cyst. Would consider follow-up pelvic ultrasound in 2-3
months to assess for gradual resolution.

## 2017-04-29 ENCOUNTER — Encounter: Payer: Self-pay | Admitting: Obstetrics & Gynecology

## 2017-06-08 ENCOUNTER — Encounter: Payer: BLUE CROSS/BLUE SHIELD | Admitting: Obstetrics & Gynecology

## 2017-06-14 ENCOUNTER — Encounter: Payer: Self-pay | Admitting: Obstetrics & Gynecology

## 2017-06-15 ENCOUNTER — Other Ambulatory Visit: Payer: Self-pay

## 2017-06-15 ENCOUNTER — Encounter: Payer: Self-pay | Admitting: Obstetrics & Gynecology

## 2017-06-15 ENCOUNTER — Ambulatory Visit (INDEPENDENT_AMBULATORY_CARE_PROVIDER_SITE_OTHER): Payer: 59 | Admitting: Obstetrics & Gynecology

## 2017-06-15 VITALS — BP 104/60 | HR 105 | Ht 64.0 in | Wt 182.0 lb

## 2017-06-15 DIAGNOSIS — N809 Endometriosis, unspecified: Secondary | ICD-10-CM

## 2017-06-15 DIAGNOSIS — Z01818 Encounter for other preprocedural examination: Secondary | ICD-10-CM

## 2017-06-15 DIAGNOSIS — R102 Pelvic and perineal pain: Secondary | ICD-10-CM | POA: Diagnosis not present

## 2017-06-15 NOTE — Progress Notes (Signed)
Preoperative History and Physical  Selena Mann is a 31 y.o. G1P0010 with No LMP recorded. Patient is not currently having periods (Reason: Oral contraceptives). admitted for a TAH BSO.  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  She underwent exploratory laparotomy with excision of both endometriomas and excision of any other in the January 2017. Postoperatively she was treated with 6 months of Lupron She was then placed on June traditional birth control pills then progesterone only therapy with megestrol She is continued to have chronic pelvic pain with occasional bleeding but significantly her pain has continued She desires to have definitive therapy via total abdominal hysterectomy with removal of both tubes and ovaries She certainly understands that this will make her immediately menopausal and of course she will not be able to have any children in the future She and I have discussed these issues at length on multiple occasions over the last 2 years along with her life partner on also multiple occasions She understands there is a risk of bowel and bladder injury given the findings at the time of her last surgery The surgery is planned for January 4  Of note with her last surgery she had a postoperative pneumonia and then subsequently pseudomembranous colitis with a positive C. Difficile She had been in the hospital at [facility] week and a half before surgery and probably got exposed at that point to a healthcare associated pathogen and had been on Dilaudid pain medicine for her pain management prior to surgery which probably decreased her respiratory volumes setting her up for a perioperative pneumonia Hopefully that will not be the case with this surgery   PMH:    Past Medical History:  Diagnosis Date  . Depression   . Endometriosis     PSH:     Past Surgical History:  Procedure Laterality Date  . EXCISION OF ENDOMETRIOMA     . LAPAROTOMY N/A 07/04/2015   Procedure: EXPLORATORY LAPAROTOMY, REMOVAL OF BILATERAL OVARIAN ENDOMETRIOMAS;  Surgeon: Lazaro Arms, MD;  Location: [facility];  Service: Gynecology;  Laterality: N/A;  . TONSILLECTOMY      POb/GynH:      OB History    Gravida Para Term Preterm AB Living   1 0 0 0 1 0   SAB TAB Ectopic Multiple Live Births   1 0 0 0        SH:   Social History   Tobacco Use  . Smoking status: Former Smoker    Years: 4.00    Types: Cigarettes  . Smokeless tobacco: Never Used  Substance Use Topics  . Alcohol use: Yes    Comment: occasional  . Drug use: No    FH:    Family History  Problem Relation Age of Onset  . Anxiety disorder Mother   . Asthma Mother   . Other Father        heart defect  . Hypertension Father   . Hyperlipidemia Father   . Diabetes Father        pre-diabetes  . Other Brother        heart defect  . Diabetes Maternal Grandmother   . Hyperthyroidism Maternal Grandmother        hypothyroid after surgery  . Stroke Maternal Grandmother   . Cancer Maternal Grandfather   . Diabetes Paternal Grandmother   . Other Brother        heart defect     Allergies: No Known Allergies  Medications:       Current Outpatient Medications:  .  buPROPion (WELLBUTRIN XL) 150 MG 24 hr tablet, Take 150 mg by mouth daily., Disp: , Rfl:  .  escitalopram (LEXAPRO) 20 MG tablet, Take 20 mg by mouth daily., Disp: , Rfl:  .  ketorolac (TORADOL) 10 MG tablet, Take 1 tablet (10 mg total) by mouth every 8 (eight) hours as needed., Disp: 15 tablet, Rfl: 3 .  loratadine (CLARITIN) 10 MG tablet, Take 10 mg by mouth daily as needed. Reported on 07/16/2015, Disp: , Rfl:  .  megestrol (MEGACE) 40 MG tablet, 3 tablets a day for 5 days, 2 tablets a day for 5 days then 1 tablet daily, Disp: 45 tablet, Rfl: 3 .  Multiple Vitamin (MULTIVITAMIN WITH MINERALS) TABS tablet, Take 1 tablet by mouth daily., Disp: , Rfl:  .  Omega-3 Fatty Acids (FISH OIL) 1000 MG CAPS, Take 1,000  mg by mouth daily., Disp: , Rfl:  .  desogestrel-ethinyl estradiol (APRI,EMOQUETTE,SOLIA) 0.15-30 MG-MCG tablet, Take 1 tablet by mouth daily. (Patient not taking: Reported on 01/06/2017), Disp: 1 Package, Rfl: 15  Review of Systems:   Review of Systems  Constitutional: Negative for fever, chills, weight loss, malaise/fatigue and diaphoresis.  HENT: Negative for hearing loss, ear pain, nosebleeds, congestion, sore throat, neck pain, tinnitus and ear discharge.   Eyes: Negative for blurred vision, double vision, photophobia, pain, discharge and redness.  Respiratory: Negative for cough, hemoptysis, sputum production, shortness of breath, wheezing and stridor.   Cardiovascular: Negative for chest pain, palpitations, orthopnea, claudication, leg swelling and PND.  Gastrointestinal: Positive for abdominal pain. Negative for heartburn, nausea, vomiting, diarrhea, constipation, blood in stool and melena.  Genitourinary: Negative for dysuria, urgency, frequency, hematuria and flank pain.  Musculoskeletal: Negative for myalgias, back pain, joint pain and falls.  Skin: Negative for itching and rash.  Neurological: Negative for dizziness, tingling, tremors, sensory change, speech change, focal weakness, seizures, loss of consciousness, weakness and headaches.  Endo/Heme/Allergies: Negative for environmental allergies and polydipsia. Does not bruise/bleed easily.  Psychiatric/Behavioral: Negative for depression, suicidal ideas, hallucinations, memory loss and substance abuse. The patient is not nervous/anxious and does not have insomnia.      PHYSICAL EXAM:  Blood pressure 104/60, pulse (!) 105, height 5\' 4"  (1.626 m), weight 182 lb (82.6 kg).    Vitals reviewed. Constitutional: She is oriented to person, place, and time. She appears well-developed and well-nourished.  HENT:  Head: Normocephalic and atraumatic.  Right Ear: External ear normal.  Left Ear: External ear normal.  Nose: Nose normal.   Mouth/Throat: Oropharynx is clear and moist.  Eyes: Conjunctivae and EOM are normal. Pupils are equal, round, and reactive to light. Right eye exhibits no discharge. Left eye exhibits no discharge. No scleral icterus.  Neck: Normal range of motion. Neck supple. No tracheal deviation present. No thyromegaly present.  Cardiovascular: Normal rate, regular rhythm, normal heart sounds and intact distal pulses.  Exam reveals no gallop and no friction rub.   No murmur heard. Respiratory: Effort normal and breath sounds normal. No respiratory distress. She has no wheezes. She has no rales. She exhibits no tenderness.  GI: Soft. Bowel sounds are normal. She exhibits no distension and no mass. There is tenderness. There is no rebound and no guarding.  Genitourinary:       Vulva is normal without lesions Vagina is pink moist without discharge Cervix normal in appearance and pap is normal Uterus is tender to palpation, NSSC Adnexa is negative with  normal sized ovaries by sonogram , tender to palpation Musculoskeletal: Normal range of motion. She exhibits no edema and no tenderness.  Neurological: She is alert and oriented to person, place, and time. She has normal reflexes. She displays normal reflexes. No cranial nerve deficit. She exhibits normal muscle tone. Coordination normal.  Skin: Skin is warm and dry. No rash noted. No erythema. No pallor.  Psychiatric: She has a normal mood and affect. Her behavior is normal. Judgment and thought content normal.    Labs: No results found for this or any previous visit (from the past 336 hour(s)).  EKG: No orders found for this or any previous visit.  Imaging Studies: No results found.    Assessment: Severe endometriosis S/P laparotomy in past for 12 cm bilateral endometriomas 07/2015 S/P 6 months Lupron therapy S/P chronic progesterone only therapy Chronic pelvic pain   Patient Active Problem List   Diagnosis Date Noted  . HCAP  (healthcare-associated pneumonia) 07/07/2015  . S/P ovarian cystectomy 07/04/2015  . Endometrioma of ovary 06/30/2015  . PID (acute pelvic inflammatory disease) 06/29/2015    Plan: TAH BSO 07/03/2017  Pt understands the risks of surgery including but not limited t  excessive bleeding requiring transfusion or reoperation, post-operative infection requiring prolonged hospitalization or re-hospitalization and antibiotic therapy, and damage to other organs including bladder, bowel, ureters and major vessels.  The patient also understands the alternative treatment options which were discussed in full.  All questions were answered.  Lazaro ArmsLuther H Kiko Ripp 06/15/2017 3:37 PM   Lazaro ArmsLuther H Delshawn Stech 06/15/2017 3:24 PM     Face to face time:  25 minutes  Greater than 50% of the visit time was spent in counseling and coordination of care with the patient.  The summary and outline of the counseling and care coordination is summarized in the note above.   All questions were answered.

## 2017-06-16 NOTE — Patient Instructions (Addendum)
Your procedure is scheduled on: Friday July 03, 2017 at 8:30 am  [facility] through [facility] at: 7:00 am  Pick up the phone at the desk and dial [phone].  Call this number if you have problems the morning of surgery: [phone].  Remember: Do NOT eat food or drink any liquids after: Midnight on Thursday January 3  Take these medicines the morning of surgery with a SIP OF WATER: Wellbutrin XL, (Claritin if needed)  STOP ALL VITAMINS AND SUPPLEMENTS 1 WEEK PRIOR TO SURGERY  DO NOT SMOKE DAY OF SURGERY  Do NOT wear jewelry (body piercing), metal hair clips/bobby pins, make-up, artificial eye lashes or nail polish. Do NOT wear lotions, powders, or perfumes.  You may wear deoderant. Do NOT shave for 48 hours prior to surgery. Do NOT bring valuables to the hospital. Contacts, dentures, or bridgework may not be worn into surgery.  Leave suitcase in car.  After surgery it may be brought to your room.  For patients admitted to the hospital, checkout time is 11:00 AM the day of discharge.

## 2017-06-24 ENCOUNTER — Other Ambulatory Visit: Payer: Self-pay | Admitting: Obstetrics & Gynecology

## 2017-06-26 ENCOUNTER — Encounter (HOSPITAL_COMMUNITY): Payer: Self-pay

## 2017-06-26 ENCOUNTER — Encounter (HOSPITAL_COMMUNITY)
Admission: RE | Admit: 2017-06-26 | Discharge: 2017-06-26 | Disposition: A | Discharge status: Home / Self Care | Payer: 59 | Source: Ambulatory Visit | Attending: Obstetrics & Gynecology | Admitting: Obstetrics & Gynecology

## 2017-06-26 ENCOUNTER — Other Ambulatory Visit: Payer: Self-pay

## 2017-06-26 DIAGNOSIS — Z0183 Encounter for blood typing: Secondary | ICD-10-CM | POA: Diagnosis not present

## 2017-06-26 DIAGNOSIS — Z01818 Encounter for other preprocedural examination: Secondary | ICD-10-CM | POA: Insufficient documentation

## 2017-06-26 DIAGNOSIS — Z01812 Encounter for preprocedural laboratory examination: Secondary | ICD-10-CM | POA: Insufficient documentation

## 2017-06-26 DIAGNOSIS — N809 Endometriosis, unspecified: Secondary | ICD-10-CM | POA: Diagnosis not present

## 2017-06-26 HISTORY — DX: Pneumonia, unspecified organism: J18.9

## 2017-06-26 HISTORY — DX: Anxiety disorder, unspecified: F41.9

## 2017-06-26 HISTORY — DX: Anemia, unspecified: D64.9

## 2017-06-26 LAB — URINALYSIS, ROUTINE W REFLEX MICROSCOPIC
BILIRUBIN URINE: NEGATIVE
Bacteria, UA: NONE SEEN
Glucose, UA: NEGATIVE mg/dL
KETONES UR: NEGATIVE mg/dL
NITRITE: NEGATIVE
PROTEIN: NEGATIVE mg/dL
SPECIFIC GRAVITY, URINE: 1.003 — AB (ref 1.005–1.030)
pH: 6 (ref 5.0–8.0)

## 2017-06-26 LAB — COMPREHENSIVE METABOLIC PANEL
ALK PHOS: 68 U/L (ref 38–126)
ALT: 14 U/L (ref 14–54)
ANION GAP: 10 (ref 5–15)
AST: 20 U/L (ref 15–41)
Albumin: 4.3 g/dL (ref 3.5–5.0)
BUN: 12 mg/dL (ref 6–20)
CALCIUM: 9.4 mg/dL (ref 8.9–10.3)
CO2: 22 mmol/L (ref 22–32)
CREATININE: 0.8 mg/dL (ref 0.44–1.00)
Chloride: 107 mmol/L (ref 101–111)
Glucose, Bld: 92 mg/dL (ref 65–99)
Potassium: 4.3 mmol/L (ref 3.5–5.1)
Sodium: 139 mmol/L (ref 135–145)
TOTAL PROTEIN: 7.3 g/dL (ref 6.5–8.1)
Total Bilirubin: 0.6 mg/dL (ref 0.3–1.2)

## 2017-06-26 LAB — CBC
HCT: 38.2 % (ref 36.0–46.0)
Hemoglobin: 12.3 g/dL (ref 12.0–15.0)
MCH: 25.6 pg — AB (ref 26.0–34.0)
MCHC: 32.2 g/dL (ref 30.0–36.0)
MCV: 79.6 fL (ref 78.0–100.0)
PLATELETS: 351 10*3/uL (ref 150–400)
RBC: 4.8 MIL/uL (ref 3.87–5.11)
RDW: 14.6 % (ref 11.5–15.5)
WBC: 9 10*3/uL (ref 4.0–10.5)

## 2017-06-26 LAB — RAPID HIV SCREEN (HIV 1/2 AB+AG)
HIV 1/2 Antibodies: NONREACTIVE
HIV-1 P24 ANTIGEN - HIV24: NONREACTIVE

## 2017-06-26 LAB — TYPE AND SCREEN
ABO/RH(D): O POS
Antibody Screen: NEGATIVE

## 2017-06-26 LAB — ABO/RH: ABO/RH(D): O POS

## 2017-06-26 LAB — HCG, QUANTITATIVE, PREGNANCY

## 2017-07-01 DIAGNOSIS — Z029 Encounter for administrative examinations, unspecified: Secondary | ICD-10-CM

## 2017-07-03 ENCOUNTER — Other Ambulatory Visit: Payer: Self-pay

## 2017-07-03 ENCOUNTER — Inpatient Hospital Stay (HOSPITAL_COMMUNITY): Payer: 59 | Admitting: Anesthesiology

## 2017-07-03 ENCOUNTER — Inpatient Hospital Stay (HOSPITAL_COMMUNITY)
Admission: RE | Admit: 2017-07-03 | Discharge: 2017-07-04 | DRG: 983 | Disposition: A | Discharge status: Home / Self Care | Payer: 59 | Source: Ambulatory Visit | Attending: Obstetrics & Gynecology | Admitting: Obstetrics & Gynecology

## 2017-07-03 ENCOUNTER — Encounter (HOSPITAL_COMMUNITY): Payer: Self-pay

## 2017-07-03 ENCOUNTER — Encounter (HOSPITAL_COMMUNITY)
Admission: RE | Disposition: A | Discharge status: Home / Self Care | Payer: Self-pay | Source: Ambulatory Visit | Attending: Obstetrics & Gynecology

## 2017-07-03 DIAGNOSIS — Z90722 Acquired absence of ovaries, bilateral: Secondary | ICD-10-CM

## 2017-07-03 DIAGNOSIS — N802 Endometriosis of fallopian tube: Secondary | ICD-10-CM

## 2017-07-03 DIAGNOSIS — N809 Endometriosis, unspecified: Secondary | ICD-10-CM | POA: Diagnosis present

## 2017-07-03 DIAGNOSIS — R102 Pelvic and perineal pain: Secondary | ICD-10-CM | POA: Diagnosis present

## 2017-07-03 DIAGNOSIS — G8929 Other chronic pain: Secondary | ICD-10-CM | POA: Diagnosis present

## 2017-07-03 DIAGNOSIS — N801 Endometriosis of ovary: Secondary | ICD-10-CM

## 2017-07-03 DIAGNOSIS — Z90710 Acquired absence of both cervix and uterus: Secondary | ICD-10-CM

## 2017-07-03 DIAGNOSIS — Z87891 Personal history of nicotine dependence: Secondary | ICD-10-CM

## 2017-07-03 DIAGNOSIS — Z9079 Acquired absence of other genital organ(s): Secondary | ICD-10-CM

## 2017-07-03 HISTORY — PX: ABDOMINAL HYSTERECTOMY: SHX81

## 2017-07-03 SURGERY — HYSTERECTOMY, ABDOMINAL
Anesthesia: General | Laterality: Bilateral

## 2017-07-03 MED ORDER — KETOROLAC TROMETHAMINE 30 MG/ML IJ SOLN
30.0000 mg | Freq: Four times a day (QID) | INTRAMUSCULAR | Status: AC
  Administered 2017-07-03 – 2017-07-04 (×3): 30 mg via INTRAVENOUS
  Filled 2017-07-03 (×3): qty 1

## 2017-07-03 MED ORDER — KCL IN DEXTROSE-NACL 20-5-0.45 MEQ/L-%-% IV SOLN
INTRAVENOUS | Status: DC
  Administered 2017-07-03 (×2): via INTRAVENOUS
  Filled 2017-07-03 (×2): qty 1000

## 2017-07-03 MED ORDER — PROPOFOL 10 MG/ML IV BOLUS
INTRAVENOUS | Status: AC
Start: 2017-07-03 — End: ?
  Filled 2017-07-03: qty 20

## 2017-07-03 MED ORDER — GLYCOPYRROLATE 0.2 MG/ML IJ SOLN
INTRAMUSCULAR | Status: AC
  Filled 2017-07-03: qty 3

## 2017-07-03 MED ORDER — ENOXAPARIN SODIUM 40 MG/0.4ML ~~LOC~~ SOLN
40.0000 mg | SUBCUTANEOUS | Status: DC
  Administered 2017-07-03: 40 mg via SUBCUTANEOUS
  Filled 2017-07-03: qty 0.4

## 2017-07-03 MED ORDER — MIDAZOLAM HCL 2 MG/2ML IJ SOLN
INTRAMUSCULAR | Status: AC
  Filled 2017-07-03: qty 2

## 2017-07-03 MED ORDER — ROCURONIUM BROMIDE 100 MG/10ML IV SOLN
INTRAVENOUS | Status: AC
  Filled 2017-07-03: qty 1

## 2017-07-03 MED ORDER — KETOROLAC TROMETHAMINE 30 MG/ML IJ SOLN
INTRAMUSCULAR | Status: AC
  Filled 2017-07-03: qty 1

## 2017-07-03 MED ORDER — SIMETHICONE 80 MG PO CHEW: 80.0000 mg | CHEWABLE_TABLET | Freq: Four times a day (QID) | ORAL | Status: DC | PRN

## 2017-07-03 MED ORDER — HYDROMORPHONE HCL 1 MG/ML IJ SOLN
INTRAMUSCULAR | Status: AC
  Administered 2017-07-03: 0.5 mg via INTRAVENOUS
  Filled 2017-07-03: qty 1

## 2017-07-03 MED ORDER — LIDOCAINE HCL (CARDIAC) 20 MG/ML IV SOLN
INTRAVENOUS | Status: DC | PRN
  Administered 2017-07-03: 100 mg via INTRAVENOUS

## 2017-07-03 MED ORDER — CEFAZOLIN SODIUM-DEXTROSE 2-4 GM/100ML-% IV SOLN
2.0000 g | INTRAVENOUS | Status: AC
  Administered 2017-07-03: 2 g via INTRAVENOUS
  Filled 2017-07-03: qty 100

## 2017-07-03 MED ORDER — EPHEDRINE SULFATE 50 MG/ML IJ SOLN
INTRAMUSCULAR | Status: DC | PRN
  Administered 2017-07-03: 5 mg via INTRAVENOUS

## 2017-07-03 MED ORDER — LACTATED RINGERS IV SOLN
INTRAVENOUS | Status: DC
  Administered 2017-07-03 (×3): via INTRAVENOUS

## 2017-07-03 MED ORDER — PROMETHAZINE HCL 25 MG/ML IJ SOLN: 25.0000 mg | Freq: Four times a day (QID) | INTRAMUSCULAR | Status: DC | PRN

## 2017-07-03 MED ORDER — HYDROMORPHONE HCL 1 MG/ML IJ SOLN
0.2500 mg | INTRAMUSCULAR | Status: DC | PRN
  Administered 2017-07-03 (×4): 0.5 mg via INTRAVENOUS

## 2017-07-03 MED ORDER — ZOLPIDEM TARTRATE 5 MG PO TABS: 10.0000 mg | ORAL_TABLET | Freq: Every evening | ORAL | Status: DC | PRN

## 2017-07-03 MED ORDER — HYDROMORPHONE HCL 1 MG/ML IJ SOLN
1.0000 mg | INTRAMUSCULAR | Status: AC | PRN
  Administered 2017-07-03 – 2017-07-04 (×4): 1 mg via INTRAVENOUS
  Filled 2017-07-03 (×3): qty 1
  Filled 2017-07-03: qty 2

## 2017-07-03 MED ORDER — DIPHENHYDRAMINE HCL 25 MG PO CAPS
25.0000 mg | ORAL_CAPSULE | ORAL | Status: DC | PRN
  Administered 2017-07-03: 25 mg via ORAL
  Filled 2017-07-03: qty 1

## 2017-07-03 MED ORDER — CEFAZOLIN SODIUM-DEXTROSE 2-3 GM-%(50ML) IV SOLR
INTRAVENOUS | Status: AC
  Filled 2017-07-03: qty 50

## 2017-07-03 MED ORDER — ONDANSETRON HCL 4 MG/2ML IJ SOLN
INTRAMUSCULAR | Status: DC | PRN
  Administered 2017-07-03: 4 mg via INTRAVENOUS

## 2017-07-03 MED ORDER — PROPOFOL 10 MG/ML IV BOLUS
INTRAVENOUS | Status: AC
  Filled 2017-07-03: qty 20

## 2017-07-03 MED ORDER — ONDANSETRON HCL 4 MG/2ML IJ SOLN: 4.0000 mg | Freq: Once | INTRAMUSCULAR | Status: DC | PRN

## 2017-07-03 MED ORDER — ESCITALOPRAM OXALATE 20 MG PO TABS
20.0000 mg | ORAL_TABLET | Freq: Every day | ORAL | Status: DC
  Administered 2017-07-03: 20 mg via ORAL
  Filled 2017-07-03: qty 1

## 2017-07-03 MED ORDER — SUGAMMADEX SODIUM 200 MG/2ML IV SOLN
INTRAVENOUS | Status: DC | PRN
  Administered 2017-07-03: 164.6 mg via INTRAVENOUS

## 2017-07-03 MED ORDER — ACETAMINOPHEN 10 MG/ML IV SOLN
1000.0000 mg | Freq: Once | INTRAVENOUS | Status: AC
  Administered 2017-07-03: 1000 mg via INTRAVENOUS

## 2017-07-03 MED ORDER — DEXAMETHASONE SODIUM PHOSPHATE 10 MG/ML IJ SOLN
INTRAMUSCULAR | Status: DC | PRN
  Administered 2017-07-03: 4 mg via INTRAVENOUS

## 2017-07-03 MED ORDER — LIDOCAINE HCL (CARDIAC) 20 MG/ML IV SOLN
INTRAVENOUS | Status: AC
  Filled 2017-07-03: qty 5

## 2017-07-03 MED ORDER — ACETAMINOPHEN 10 MG/ML IV SOLN
INTRAVENOUS | Status: AC
  Administered 2017-07-03: 1000 mg via INTRAVENOUS
  Filled 2017-07-03: qty 100

## 2017-07-03 MED ORDER — KETOROLAC TROMETHAMINE 30 MG/ML IJ SOLN
30.0000 mg | Freq: Once | INTRAMUSCULAR | Status: AC
  Administered 2017-07-03: 30 mg via INTRAVENOUS

## 2017-07-03 MED ORDER — BISACODYL 10 MG RE SUPP: 10.0000 mg | Freq: Every day | RECTAL | Status: DC | PRN

## 2017-07-03 MED ORDER — MIDAZOLAM HCL 2 MG/2ML IJ SOLN
INTRAMUSCULAR | Status: DC | PRN
  Administered 2017-07-03: 2 mg via INTRAVENOUS

## 2017-07-03 MED ORDER — LIDOCAINE HCL 1 % IJ SOLN
INTRAMUSCULAR | Status: AC
  Filled 2017-07-03: qty 20

## 2017-07-03 MED ORDER — DOCUSATE SODIUM 100 MG PO CAPS
100.0000 mg | ORAL_CAPSULE | Freq: Two times a day (BID) | ORAL | Status: DC
  Administered 2017-07-03 – 2017-07-04 (×3): 100 mg via ORAL
  Filled 2017-07-03 (×3): qty 1

## 2017-07-03 MED ORDER — ROCURONIUM BROMIDE 100 MG/10ML IV SOLN
INTRAVENOUS | Status: DC | PRN
  Administered 2017-07-03: 50 mg via INTRAVENOUS
  Administered 2017-07-03: 10 mg via INTRAVENOUS

## 2017-07-03 MED ORDER — KETOROLAC TROMETHAMINE 30 MG/ML IJ SOLN
INTRAMUSCULAR | Status: AC
  Administered 2017-07-03: 30 mg via INTRAVENOUS
  Filled 2017-07-03: qty 1

## 2017-07-03 MED ORDER — OXYCODONE HCL 5 MG PO TABS
ORAL_TABLET | ORAL | Status: AC
  Administered 2017-07-03: 5 mg via ORAL
  Filled 2017-07-03: qty 1

## 2017-07-03 MED ORDER — ONDANSETRON HCL 4 MG PO TABS: 8.0000 mg | ORAL_TABLET | Freq: Four times a day (QID) | ORAL | Status: DC | PRN

## 2017-07-03 MED ORDER — ACETAMINOPHEN 10 MG/ML IV SOLN: 1000.0000 mg | Freq: Four times a day (QID) | INTRAVENOUS | Status: DC

## 2017-07-03 MED ORDER — BUPIVACAINE LIPOSOME 1.3 % IJ SUSP
INTRAMUSCULAR | Status: DC | PRN
  Administered 2017-07-03: 20 mL

## 2017-07-03 MED ORDER — ZOLPIDEM TARTRATE 5 MG PO TABS: 5.0000 mg | ORAL_TABLET | Freq: Every evening | ORAL | Status: DC | PRN

## 2017-07-03 MED ORDER — ONDANSETRON HCL 4 MG/2ML IJ SOLN
INTRAMUSCULAR | Status: AC
  Filled 2017-07-03: qty 2

## 2017-07-03 MED ORDER — OXYCODONE HCL 5 MG PO TABS
5.0000 mg | ORAL_TABLET | Freq: Once | ORAL | Status: AC | PRN
  Administered 2017-07-03: 5 mg via ORAL

## 2017-07-03 MED ORDER — OXYCODONE HCL 5 MG/5ML PO SOLN: 5.0000 mg | Freq: Once | ORAL | Status: AC | PRN

## 2017-07-03 MED ORDER — SCOPOLAMINE 1 MG/3DAYS TD PT72
MEDICATED_PATCH | TRANSDERMAL | Status: AC
  Administered 2017-07-03: 1.5 mg via TRANSDERMAL
  Filled 2017-07-03: qty 1

## 2017-07-03 MED ORDER — BUPIVACAINE LIPOSOME 1.3 % IJ SUSP
20.0000 mL | Freq: Once | INTRAMUSCULAR | Status: DC
  Filled 2017-07-03: qty 20

## 2017-07-03 MED ORDER — PROPOFOL 10 MG/ML IV BOLUS
INTRAVENOUS | Status: DC | PRN
  Administered 2017-07-03: 170 mg via INTRAVENOUS

## 2017-07-03 MED ORDER — SUGAMMADEX SODIUM 200 MG/2ML IV SOLN
INTRAVENOUS | Status: AC
  Filled 2017-07-03: qty 2

## 2017-07-03 MED ORDER — OXYCODONE-ACETAMINOPHEN 7.5-325 MG PO TABS
1.0000 | ORAL_TABLET | ORAL | Status: DC | PRN
  Administered 2017-07-03 – 2017-07-04 (×2): 2 via ORAL
  Filled 2017-07-03 (×2): qty 2

## 2017-07-03 MED ORDER — SODIUM CHLORIDE 0.9 % IV SOLN: 8.0000 mg | Freq: Four times a day (QID) | INTRAVENOUS | Status: DC | PRN

## 2017-07-03 MED ORDER — FENTANYL CITRATE (PF) 100 MCG/2ML IJ SOLN
INTRAMUSCULAR | Status: DC | PRN
  Administered 2017-07-03: 50 ug via INTRAVENOUS
  Administered 2017-07-03 (×2): 100 ug via INTRAVENOUS

## 2017-07-03 MED ORDER — DEXAMETHASONE SODIUM PHOSPHATE 4 MG/ML IJ SOLN
INTRAMUSCULAR | Status: AC
  Filled 2017-07-03: qty 1

## 2017-07-03 MED ORDER — BUPROPION HCL ER (XL) 150 MG PO TB24
150.0000 mg | ORAL_TABLET | Freq: Every day | ORAL | Status: DC
  Administered 2017-07-04: 150 mg via ORAL
  Filled 2017-07-03: qty 1

## 2017-07-03 MED ORDER — FENTANYL CITRATE (PF) 250 MCG/5ML IJ SOLN
INTRAMUSCULAR | Status: AC
  Filled 2017-07-03: qty 5

## 2017-07-03 MED ORDER — SCOPOLAMINE 1 MG/3DAYS TD PT72
1.0000 | MEDICATED_PATCH | Freq: Once | TRANSDERMAL | Status: DC
  Administered 2017-07-03: 1.5 mg via TRANSDERMAL

## 2017-07-03 SURGICAL SUPPLY — 37 items
APPLIER CLIP 13 LRG OPEN (CLIP)
BAG HAMPER (MISCELLANEOUS) ×3 IMPLANT
CELLS DAT CNTRL 66122 CELL SVR (MISCELLANEOUS) IMPLANT
CLIP APPLIE 13 LRG OPEN (CLIP) IMPLANT
CLOTH BEACON ORANGE TIMEOUT ST (SAFETY) ×3 IMPLANT
DERMABOND ADVANCED (GAUZE/BANDAGES/DRESSINGS)
DERMABOND ADVANCED .7 DNX12 (GAUZE/BANDAGES/DRESSINGS) IMPLANT
DRAPE WARM FLUID 44X44 (DRAPE) ×3 IMPLANT
DRSG OPSITE POSTOP 4X10 (GAUZE/BANDAGES/DRESSINGS) ×3 IMPLANT
DRSG OPSITE POSTOP 4X12 (GAUZE/BANDAGES/DRESSINGS) ×3 IMPLANT
DRSG TELFA 3X8 NADH (GAUZE/BANDAGES/DRESSINGS) IMPLANT
ELECT REM PT RETURN 9FT ADLT (ELECTROSURGICAL) ×3
ELECTRODE REM PT RTRN 9FT ADLT (ELECTROSURGICAL) ×1 IMPLANT
GLOVE BIOGEL PI IND STRL 7.0 (GLOVE) ×2 IMPLANT
GLOVE BIOGEL PI IND STRL 8 (GLOVE) ×1 IMPLANT
GLOVE BIOGEL PI INDICATOR 7.0 (GLOVE) ×4
GLOVE BIOGEL PI INDICATOR 8 (GLOVE) ×2
GLOVE ECLIPSE 8.0 STRL XLNG CF (GLOVE) ×3 IMPLANT
GOWN STRL REUS W/TWL LRG LVL3 (GOWN DISPOSABLE) ×6 IMPLANT
GOWN STRL REUS W/TWL XL LVL3 (GOWN DISPOSABLE) ×3 IMPLANT
NEEDLE HYPO 21X1.5 SAFETY (NEEDLE) ×3 IMPLANT
NS IRRIG 1000ML POUR BTL (IV SOLUTION) ×6 IMPLANT
PACK ABDOMINAL GYN (CUSTOM PROCEDURE TRAY) ×3 IMPLANT
RETRACTOR WND ALEXIS 25 LRG (MISCELLANEOUS) ×1 IMPLANT
RTRCTR WOUND ALEXIS 18CM MED (MISCELLANEOUS)
RTRCTR WOUND ALEXIS 25CM LRG (MISCELLANEOUS) ×3
SUT CHROMIC 0 CT 1 (SUTURE) ×3 IMPLANT
SUT MON AB 3-0 SH 27 (SUTURE) ×6
SUT MON AB 3-0 SH27 (SUTURE) ×3 IMPLANT
SUT PLAIN 2 0 XLH (SUTURE) IMPLANT
SUT VIC AB 0 CT1 27 (SUTURE) ×4
SUT VIC AB 0 CT1 27XCR 8 STRN (SUTURE) ×2 IMPLANT
SUT VIC AB 0 CTX 36 (SUTURE) ×2
SUT VIC AB 0 CTX36XBRD ANBCTRL (SUTURE) ×1 IMPLANT
SYR 20CC LL (SYRINGE) ×3 IMPLANT
TOWEL OR 17X24 6PK STRL BLUE (TOWEL DISPOSABLE) ×6 IMPLANT
TRAY FOLEY CATH 16FR SILVER (SET/KITS/TRAYS/PACK) ×3 IMPLANT

## 2017-07-03 NOTE — Anesthesia Postprocedure Evaluation (Signed)
Anesthesia Post Note  Patient: Selena Mann  Procedure(s) Performed: HYSTERECTOMY ABDOMINAL WITH BILATERAL SAPINGO-OOPHORECTOMY (Bilateral )     Patient location during evaluation: PACU Anesthesia Type: General Level of consciousness: awake and alert Pain management: pain level controlled Vital Signs Assessment: post-procedure vital signs reviewed and stable Respiratory status: spontaneous breathing, nonlabored ventilation, respiratory function stable and patient connected to nasal cannula oxygen Cardiovascular status: blood pressure returned to baseline and stable Postop Assessment: no apparent nausea or vomiting Anesthetic complications: no    Last Vitals:  Vitals:   07/03/17 1135 07/03/17 1155  BP: 127/73 121/77  Pulse: 88 98  Resp: 18 18  Temp:  36.7 C  SpO2: 99% 98%    Last Pain:  Vitals:   07/03/17 1206  TempSrc:   PainSc: 5    Pain Goal: Patients Stated Pain Goal: 5 (07/03/17 1206)               Sunya Humbarger

## 2017-07-03 NOTE — Anesthesia Preprocedure Evaluation (Signed)
Anesthesia Evaluation  Patient identified by MRN, date of birth, ID band Patient awake    Reviewed: Allergy & Precautions, NPO status , Patient's Chart, lab work & pertinent test results  Airway Mallampati: II  TM Distance: >3 FB Neck ROM: Full    Dental  (+) Teeth Intact, Dental Advisory Given   Pulmonary former smoker,    breath sounds clear to auscultation       Cardiovascular  Rhythm:Regular Rate:Normal     Neuro/Psych    GI/Hepatic   Endo/Other    Renal/GU      Musculoskeletal   Abdominal   Peds  Hematology   Anesthesia Other Findings   Reproductive/Obstetrics                             Anesthesia Physical Anesthesia Plan  ASA: II  Anesthesia Plan: General   Post-op Pain Management:    Induction: Intravenous  PONV Risk Score and Plan: Ondansetron and Dexamethasone  Airway Management Planned: Oral ETT  Additional Equipment:   Intra-op Plan:   Post-operative Plan: Extubation in OR  Informed Consent: I have reviewed the patients History and Physical, chart, labs and discussed the procedure including the risks, benefits and alternatives for the proposed anesthesia with the patient or authorized representative who has indicated his/her understanding and acceptance.   Dental advisory given  Plan Discussed with: Anesthesiologist and CRNA  Anesthesia Plan Comments:         Anesthesia Quick Evaluation

## 2017-07-03 NOTE — Op Note (Signed)
Preoperative diagnosis:  1.  Severe endometriosis                                          2.  S/P exploratory laparotomy 07/2015 with removal of bilateral                                                                       endometriomas                                         3.  S/P Lupron therapy 6 months post op                                         4.  S/P chronic progesterone only therapy                                         5.  Chronic pelvic pain                                         6.  Desires definitive operative therapy  Postoperative diagnosis:  Same as above   Procedure:  Total abdominal hysterectomy with bilateral salpingo oophorectomy  Surgeon:  Lazaro Arms  Assistant:    Anesthesia:  General endotracheal  Preoperative clinical summary:   Selena Mann a 32 y.o.G1P0010 with No LMP recorded. Patient is not currently having periods (Reason: Oral contraceptives).admitted for a TAH BSO. 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 Postoperatively she was treated with 6 months of Lupron She was then placed on June traditional birth control pills then progesterone only therapy with megestrol She is continued to have chronic pelvic pain with occasional bleeding but significantly her pain has continued She desires to have definitive therapy via total abdominal hysterectomy with removal of both tubes and ovaries She certainly understands that this will make her immediately menopausal and of course she will not be able to have any children in the future She and I have discussed these issues at length on multiple occasions over the last 2 years along with her life partner on also multiple occasions She understands there is a risk of bowel and bladder injury given the findings at the time of her last  surgery The surgery is planned for January 4  Of note with her last surgery she had a postoperative pneumonia and then subsequently pseudomembranous colitis with a positive C. Difficile She had been in the hospital at [facility] week and a half before surgery and probably got exposed at that point to a healthcare associated pathogen and had been on Dilaudid pain  medicine for her pain management prior to surgery which probably decreased her respiratory volumes setting her up for a perioperative pneumonia Hopefully that will not be the case with this surgery    Intraoperative findings: Both ovaries were essentially normal appearing s/p excision of endometriomas.  There were some adhesions of the sigmoid colon to the posterior uterus and cul de sac but was much better than 2 years ago.  Scattered small powder burn lesions were present  Description of operation:  Patient was taken to the operating room and placed in the supine position where she underwent general endotracheal anesthesia.  She was then prepped and draped in the usual sterile fashion and a Foley catheter was placed for continuous bladder drainage.  A Pfannenstiel skin incision was made and carried down sharply to the rectus fascia which was scored in the midline and extended laterally.  The fascia was taken off the muscles superiorly and inferiorly without difficulty.  The muscles were divided.  The peritoneal cavity was entered.  A medium Alexis self-retaining retractor was placed.  The upper abdomen was packed away. Both uterine cornu were grasped with Coker clamps.  The left round ligament was suture ligated and coagulated with the electrocautery unit.  The left vesicouterine serosal flap was created.  An avascular window in in the peritoneum was created and the infundibulo pelvic ligament was cross clamped, cut and suture ligated.  The right round ligament was suture ligated and cut with the electrocautery unit.  The vesicouterine  serosal flap on the right was created.  An avascular window in the peritoneum was created and the right infundibulo pelvic ligament was cross clamped, cut and double suture ligated.  Thus both ovaries were removed.  The uterine vessels were skeletonized bilaterally.  The uterine vessels were clamped bilaterally,  then cut and suture ligated.  Two more pedicles were taken down the cervix medial to the uterine vessels.  Each pedicle was clamped cut and suture ligated with good resulting hemostasis.  The vagina was cross clamped and the uterus and cervix were removed en bloc.  Vaginal angle sutures were placed.  The vagina was closed with interrupted figure of eight sutures with good hemostasis, The pelvis was irrigated vigorously and all pedicles were examined and found to be hemostatic. The pelvic peritoneum was closed posteiro to anteriorly from ovarian pedicle to ovarian pedicle.  All specimens were sent to pathology for routine evaluation.  The Alexis self-retaining retractor was removed and the pelvis was irrigated vigorously.  All packs were removed and all counts were correct at this point x 3.  The muscles and peritoneum were reapproximated loosely.  The fascia was closed with 0 Vicryl running. The skin was closed using 4-0 Vicryl on a Keith needle in a subcuticular fashion.  Dermabond was then applied for additional wound integrity and to serve as a postoperative bacterial barrier.  The patient was awakened from anesthesia taken to the recovery room in good stable condition. All sponge instrument and needle counts were correct x 3.  The patient received Ancef and Toradol prophylactically preoperatively.  Estimated blood loss for the procedure was 125  cc.  EURE,LUTHER H 07/03/2017 10:16 AM

## 2017-07-03 NOTE — Interval H&P Note (Signed)
History and Physical Interval Note:  07/03/2017 8:04 AM  Selena Mann  has presented today for surgery, with the diagnosis of endometriosis  The various methods of treatment have been discussed with the patient and family. After consideration of risks, benefits and other options for treatment, the patient has consented to  Procedure(s) with comments: HYSTERECTOMY ABDOMINAL (Bilateral) - with BSO TINA RNFA as a surgical intervention .  The patient's history has been reviewed, patient examined, no change in status, stable for surgery.  I have reviewed the patient's chart and labs.  Questions were answered to the patient's satisfaction.     Lazaro ArmsLuther H Aswad Wandrey

## 2017-07-03 NOTE — H&P (Signed)
Preoperative History and Physical  Selena Mann is a 32 y.o. G1P0010 with No LMP recorded. Patient is not currently having periods (Reason: Oral contraceptives). admitted for a TAH BSO.  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  She underwent exploratory laparotomy with excision of both endometriomas and excision of any other in the January 2017. Postoperatively she was treated with 6 months of Lupron She was then placed on June traditional birth control pills then progesterone only therapy with megestrol She is continued to have chronic pelvic pain with occasional bleeding but significantly her pain has continued She desires to have definitive therapy via total abdominal hysterectomy with removal of both tubes and ovaries She certainly understands that this will make her immediately menopausal and of course she will not be able to have any children in the future She and I have discussed these issues at length on multiple occasions over the last 2 years along with her life partner on also multiple occasions She understands there is a risk of bowel and bladder injury given the findings at the time of her last surgery The surgery is planned for January 4  Of note with her last surgery she had a postoperative pneumonia and then subsequently pseudomembranous colitis with a positive C. Difficile She had been in the hospital at [facility] week and a half before surgery and probably got exposed at that point to a healthcare associated pathogen and had been on Dilaudid pain medicine for her pain management prior to surgery which probably decreased her respiratory volumes setting her up for a perioperative pneumonia Hopefully that will not be the case with this surgery   PMH:        Past Medical History:  Diagnosis Date  . Depression   . Endometriosis     PSH:          Past Surgical History:  Procedure Laterality Date  . EXCISION  OF ENDOMETRIOMA    . LAPAROTOMY N/A 07/04/2015   Procedure: EXPLORATORY LAPAROTOMY, REMOVAL OF BILATERAL OVARIAN ENDOMETRIOMAS;  Surgeon: Lazaro Arms, MD;  Location: [facility];  Service: Gynecology;  Laterality: N/A;  . TONSILLECTOMY      POb/GynH:              OB History    Gravida Para Term Preterm AB Living   1 0 0 0 1 0   SAB TAB Ectopic Multiple Live Births   1 0 0 0        SH:   Social History        Tobacco Use  . Smoking status: Former Smoker    Years: 4.00    Types: Cigarettes  . Smokeless tobacco: Never Used  Substance Use Topics  . Alcohol use: Yes    Comment: occasional  . Drug use: No    FH:         Family History  Problem Relation Age of Onset  . Anxiety disorder Mother   . Asthma Mother   . Other Father        heart defect  . Hypertension Father   . Hyperlipidemia Father   . Diabetes Father        pre-diabetes  . Other Brother        heart defect  . Diabetes Maternal Grandmother   . Hyperthyroidism Maternal Grandmother        hypothyroid after surgery  . Stroke Maternal Grandmother   . Cancer Maternal Grandfather   .  Diabetes Paternal Grandmother   . Other Brother        heart defect     Allergies: No Known Allergies  Medications:       Current Outpatient Medications:  .  buPROPion (WELLBUTRIN XL) 150 MG 24 hr tablet, Take 150 mg by mouth daily., Disp: , Rfl:  .  escitalopram (LEXAPRO) 20 MG tablet, Take 20 mg by mouth daily., Disp: , Rfl:  .  ketorolac (TORADOL) 10 MG tablet, Take 1 tablet (10 mg total) by mouth every 8 (eight) hours as needed., Disp: 15 tablet, Rfl: 3 .  loratadine (CLARITIN) 10 MG tablet, Take 10 mg by mouth daily as needed. Reported on 07/16/2015, Disp: , Rfl:  .  megestrol (MEGACE) 40 MG tablet, 3 tablets a day for 5 days, 2 tablets a day for 5 days then 1 tablet daily, Disp: 45 tablet, Rfl: 3 .  Multiple Vitamin (MULTIVITAMIN WITH MINERALS) TABS tablet, Take 1 tablet by  mouth daily., Disp: , Rfl:  .  Omega-3 Fatty Acids (FISH OIL) 1000 MG CAPS, Take 1,000 mg by mouth daily., Disp: , Rfl:  .  desogestrel-ethinyl estradiol (APRI,EMOQUETTE,SOLIA) 0.15-30 MG-MCG tablet, Take 1 tablet by mouth daily. (Patient not taking: Reported on 01/06/2017), Disp: 1 Package, Rfl: 15  Review of Systems:   Review of Systems  Constitutional: Negative for fever, chills, weight loss, malaise/fatigue and diaphoresis.  HENT: Negative for hearing loss, ear pain, nosebleeds, congestion, sore throat, neck pain, tinnitus and ear discharge.   Eyes: Negative for blurred vision, double vision, photophobia, pain, discharge and redness.  Respiratory: Negative for cough, hemoptysis, sputum production, shortness of breath, wheezing and stridor.   Cardiovascular: Negative for chest pain, palpitations, orthopnea, claudication, leg swelling and PND.  Gastrointestinal: Positive for abdominal pain. Negative for heartburn, nausea, vomiting, diarrhea, constipation, blood in stool and melena.  Genitourinary: Negative for dysuria, urgency, frequency, hematuria and flank pain.  Musculoskeletal: Negative for myalgias, back pain, joint pain and falls.  Skin: Negative for itching and rash.  Neurological: Negative for dizziness, tingling, tremors, sensory change, speech change, focal weakness, seizures, loss of consciousness, weakness and headaches.  Endo/Heme/Allergies: Negative for environmental allergies and polydipsia. Does not bruise/bleed easily.  Psychiatric/Behavioral: Negative for depression, suicidal ideas, hallucinations, memory loss and substance abuse. The patient is not nervous/anxious and does not have insomnia.      PHYSICAL EXAM:  Blood pressure 104/60, pulse (!) 105, height 5\' 4"  (1.626 m), weight 182 lb (82.6 kg).    Vitals reviewed. Constitutional: She is oriented to person, place, and time. She appears well-developed and well-nourished.  HENT:  Head: Normocephalic and  atraumatic.  Right Ear: External ear normal.  Left Ear: External ear normal.  Nose: Nose normal.  Mouth/Throat: Oropharynx is clear and moist.  Eyes: Conjunctivae and EOM are normal. Pupils are equal, round, and reactive to light. Right eye exhibits no discharge. Left eye exhibits no discharge. No scleral icterus.  Neck: Normal range of motion. Neck supple. No tracheal deviation present. No thyromegaly present.  Cardiovascular: Normal rate, regular rhythm, normal heart sounds and intact distal pulses.  Exam reveals no gallop and no friction rub.   No murmur heard. Respiratory: Effort normal and breath sounds normal. No respiratory distress. She has no wheezes. She has no rales. She exhibits no tenderness.  GI: Soft. Bowel sounds are normal. She exhibits no distension and no mass. There is tenderness. There is no rebound and no guarding.  Genitourinary:       Vulva is normal  without lesions Vagina is pink moist without discharge Cervix normal in appearance and pap is normal Uterus is tender to palpation, NSSC Adnexa is negative with normal sized ovaries by sonogram , tender to palpation Musculoskeletal: Normal range of motion. She exhibits no edema and no tenderness.  Neurological: She is alert and oriented to person, place, and time. She has normal reflexes. She displays normal reflexes. No cranial nerve deficit. She exhibits normal muscle tone. Coordination normal.  Skin: Skin is warm and dry. No rash noted. No erythema. No pallor.  Psychiatric: She has a normal mood and affect. Her behavior is normal. Judgment and thought content normal.    Labs: No results found for this or any previous visit (from the past 336 hour(s)).  EKG: No orders found for this or any previous visit.  Imaging Studies: Imaging Results  No results found.      Assessment: Severe endometriosis S/P laparotomy in past for 12 cm bilateral endometriomas 07/2015 S/P 6 months Lupron therapy S/P chronic  progesterone only therapy Chronic pelvic pain       Patient Active Problem List   Diagnosis Date Noted  . HCAP (healthcare-associated pneumonia) 07/07/2015  . S/P ovarian cystectomy 07/04/2015  . Endometrioma of ovary 06/30/2015  . PID (acute pelvic inflammatory disease) 06/29/2015    Plan: TAH BSO 07/03/2017  Pt understands the risks of surgery including but not limited t  excessive bleeding requiring transfusion or reoperation, post-operative infection requiring prolonged hospitalization or re-hospitalization and antibiotic therapy, and damage to other organs including bladder, bowel, ureters and major vessels.  The patient also understands the alternative treatment options which were discussed in full.  All questions were answered.  Lazaro Arms 06/15/2017 3:37 PM   Amaryllis Dyke Eure 06/15/2017 3:24 PM

## 2017-07-03 NOTE — Transfer of Care (Signed)
Immediate Anesthesia Transfer of Care Note  Patient: Selena Mann  Procedure(s) Performed: HYSTERECTOMY ABDOMINAL WITH BILATERAL SAPINGO-OOPHORECTOMY (Bilateral )  Patient Location: PACU  Anesthesia Type:General  Level of Consciousness: awake, alert  and oriented  Airway & Oxygen Therapy: Patient Spontanous Breathing and Patient connected to nasal cannula oxygen  Post-op Assessment: Report given to RN, Post -op Vital signs reviewed and stable and Patient moving all extremities  Post vital signs: Reviewed and stable  Last Vitals:  Vitals:   07/03/17 0656  BP: 117/80  Pulse: 91  Resp: 16  Temp: 36.8 C  SpO2: 100%    Last Pain:  Vitals:   07/03/17 0656  TempSrc: Oral  PainSc: 2       Patients Stated Pain Goal: 5 (07/03/17 0656)  Complications: No apparent anesthesia complications

## 2017-07-03 NOTE — Anesthesia Procedure Notes (Signed)
Procedure Name: Intubation Date/Time: 07/03/2017 8:21 AM Performed by: Hewitt Blade, CRNA Pre-anesthesia Checklist: Patient identified, Emergency Drugs available, Suction available and Patient being monitored Patient Re-evaluated:Patient Re-evaluated prior to induction Oxygen Delivery Method: Circle system utilized Preoxygenation: Pre-oxygenation with 100% oxygen Induction Type: IV induction Ventilation: Mask ventilation without difficulty and Oral airway inserted - appropriate to patient size Laryngoscope Size: Mac and 3 Grade View: Grade I Tube type: Oral Tube size: 7.0 mm Number of attempts: 1 Airway Equipment and Method: Stylet Placement Confirmation: ETT inserted through vocal cords under direct vision,  positive ETCO2 and breath sounds checked- equal and bilateral Secured at: 21 cm Tube secured with: Tape Dental Injury: Teeth and Oropharynx as per pre-operative assessment

## 2017-07-04 ENCOUNTER — Encounter (HOSPITAL_COMMUNITY): Payer: Self-pay | Admitting: Obstetrics & Gynecology

## 2017-07-04 LAB — CBC
HCT: 29.1 % — ABNORMAL LOW (ref 36.0–46.0)
Hemoglobin: 9.4 g/dL — ABNORMAL LOW (ref 12.0–15.0)
MCH: 26 pg (ref 26.0–34.0)
MCHC: 32.3 g/dL (ref 30.0–36.0)
MCV: 80.4 fL (ref 78.0–100.0)
PLATELETS: 333 10*3/uL (ref 150–400)
RBC: 3.62 MIL/uL — AB (ref 3.87–5.11)
RDW: 14.3 % (ref 11.5–15.5)
WBC: 11.5 10*3/uL — AB (ref 4.0–10.5)

## 2017-07-04 LAB — BASIC METABOLIC PANEL
ANION GAP: 8 (ref 5–15)
BUN: 5 mg/dL — ABNORMAL LOW (ref 6–20)
CO2: 21 mmol/L — ABNORMAL LOW (ref 22–32)
Calcium: 8.1 mg/dL — ABNORMAL LOW (ref 8.9–10.3)
Chloride: 107 mmol/L (ref 101–111)
Creatinine, Ser: 0.77 mg/dL (ref 0.44–1.00)
GFR calc Af Amer: >60 mL/min (ref >60)
GLUCOSE: 93 mg/dL (ref 65–99)
POTASSIUM: 3.8 mmol/L (ref 3.5–5.1)
Sodium: 136 mmol/L (ref 135–145)

## 2017-07-04 MED ORDER — HYDROMORPHONE HCL 2 MG PO TABS: 2.0000 mg | ORAL_TABLET | ORAL | 0 refills | Status: DC | PRN

## 2017-07-04 MED ORDER — KETOROLAC TROMETHAMINE 10 MG PO TABS: 10.0000 mg | ORAL_TABLET | Freq: Three times a day (TID) | ORAL | 0 refills | Status: DC | PRN

## 2017-07-04 MED ORDER — ONDANSETRON 8 MG PO TBDP: 8.0000 mg | ORAL_TABLET | Freq: Three times a day (TID) | ORAL | 0 refills | Status: DC | PRN

## 2017-07-04 NOTE — Addendum Note (Signed)
Addendum  created 07/04/17 0809 by Elgie CongoMalinova, Walta Bell[place] H, CRNA   Sign clinical note

## 2017-07-04 NOTE — Plan of Care (Signed)
  Activity: Risk for activity intolerance will decrease 07/04/2017 0020 by Bobbye MortonAcheampong, Kaysey Berndt, RN Note Pt encouraged to ambulate.

## 2017-07-04 NOTE — Progress Notes (Signed)
Discharged home in stable condition via w/c. 

## 2017-07-04 NOTE — Progress Notes (Signed)
Discharge instruction given to patient and she verbalized understanding of all instructions given. Written copy of AVS given to patient.

## 2017-07-04 NOTE — Anesthesia Postprocedure Evaluation (Signed)
Anesthesia Post Note  Patient: Maddelynn Gudgel  Procedure(s) Performed: HYSTERECTOMY ABDOMINAL WITH BILATERAL SAPINGO-OOPHORECTOMY (Bilateral )     Patient location during evaluation: [facility] Anesthesia Type: General Level of consciousness: awake and alert and oriented Pain management: pain level controlled Vital Signs Assessment: post-procedure vital signs reviewed and stable Respiratory status: spontaneous breathing and nonlabored ventilation Cardiovascular status: stable Postop Assessment: no apparent nausea or vomiting and adequate PO intake Anesthetic complications: no    Last Vitals:  Vitals:   07/04/17 0001 07/04/17 0408  BP: (!) 98/56 111/74  Pulse: (!) 107 91  Resp: 18 18  Temp: 36.9 C 36.4 C  SpO2: 97% 99%    Last Pain:  Vitals:   07/04/17 0408  TempSrc: Oral  PainSc:    Pain Goal: Patients Stated Pain Goal: 5 (07/03/17 1455)               Zhane Bluitt Hristova

## 2017-07-05 NOTE — Discharge Summary (Signed)
Physician Discharge Summary  Patient ID: Selena OysterOlivia Mann MRN: [id number] DOB/AGE: Nov 03, 1985 32 y.o.  Admit date: 07/03/2017 Discharge date: 07/05/2017  Admission Diagnoses: Endometriosis CPP dysmenorrhea DUB  Discharge Diagnoses:  Active Problems:   S/P TAH-BSO   Discharged Condition: good  Hospital Course: unremarkable  Consults: None  Significant Diagnostic Studies: labs:   Treatments: surgery: TAH BSO  Discharge Exam: Blood pressure 101/61, pulse 85, temperature 98.9 F (37.2 C), temperature source Oral, resp. rate 18, height 5\' 4"  (1.626 m), weight 181 lb (82.1 kg), SpO2 97 %. General appearance: alert, cooperative and no distress GI: soft, non-tender; bowel sounds normal; no masses,  no organomegaly Incision/Wound:clean dry intact   Disposition: 01-Home or Self Care  Discharge Instructions    Call MD for:  persistant nausea and vomiting   Complete by:  As directed    Call MD for:  severe uncontrolled pain   Complete by:  As directed    Call MD for:  temperature >100.4   Complete by:  As directed    Diet - low sodium heart healthy   Complete by:  As directed    Driving Restrictions   Complete by:  As directed    No driving for 1 week   Increase activity slowly   Complete by:  As directed    Leave dressing on - Keep it clean, dry, and intact until clinic visit   Complete by:  As directed    Lifting restrictions   Complete by:  As directed    Do not lift more than 10 pounds for 6 weeks   Sexual Activity Restrictions   Complete by:  As directed    Nothing in the vagina for 6 weeks     Allergies as of 07/04/2017   No Known Allergies     Medication List    TAKE these medications   buPROPion 150 MG 24 hr tablet Commonly known as:  WELLBUTRIN XL Take 150 mg by mouth daily.   escitalopram 20 MG tablet Commonly known as:  LEXAPRO Take 20 mg by mouth at bedtime.   FISH OIL PO Take 2 tablets by mouth daily.   HYDROmorphone 2 MG tablet Commonly known as:   DILAUDID Take 1 tablet (2 mg total) by mouth every 4 (four) hours as needed for severe pain.   ketorolac 10 MG tablet Commonly known as:  TORADOL Take 1 tablet (10 mg total) by mouth every 8 (eight) hours as needed. What changed:  reasons to take this   loratadine 10 MG tablet Commonly known as:  CLARITIN Take 10 mg by mouth daily as needed for allergies. Reported on 07/16/2015   megestrol 40 MG tablet Commonly known as:  MEGACE 3 tablets a day for 5 days, 2 tablets a day for 5 days then 1 tablet daily What changed:    how much to take  how to take this  when to take this  additional instructions   multivitamin with minerals Tabs tablet Take 1 tablet by mouth daily.   ondansetron 8 MG disintegrating tablet Commonly known as:  ZOFRAN ODT Take 1 tablet (8 mg total) by mouth every 8 (eight) hours as needed for nausea or vomiting.      Follow-up Information    Lazaro ArmsEure, Luther H, MD Follow up on 07/13/2017.   Specialties:  Obstetrics and Gynecology, Radiology Contact information: [address] [place] [address] [phone]           Signed: Lazaro ArmsLuther H Eure 07/05/2017, 10:57  AM

## 2017-07-06 DIAGNOSIS — N809 Endometriosis, unspecified: Secondary | ICD-10-CM | POA: Diagnosis present

## 2017-07-06 NOTE — Addendum Note (Signed)
Addendum  created 07/06/17 1353 by Bethena Midgetddono, Birdena Kingma, MD   Intraprocedure Staff edited

## 2017-07-13 ENCOUNTER — Ambulatory Visit (INDEPENDENT_AMBULATORY_CARE_PROVIDER_SITE_OTHER): Payer: 59 | Admitting: Obstetrics & Gynecology

## 2017-07-13 ENCOUNTER — Other Ambulatory Visit: Payer: Self-pay

## 2017-07-13 ENCOUNTER — Encounter: Payer: Self-pay | Admitting: Obstetrics & Gynecology

## 2017-07-13 VITALS — BP 124/76 | HR 102 | Ht 64.0 in | Wt 180.0 lb

## 2017-07-13 DIAGNOSIS — Z90710 Acquired absence of both cervix and uterus: Secondary | ICD-10-CM

## 2017-07-13 DIAGNOSIS — Z9889 Other specified postprocedural states: Secondary | ICD-10-CM

## 2017-07-13 DIAGNOSIS — Z90722 Acquired absence of ovaries, bilateral: Secondary | ICD-10-CM

## 2017-07-13 DIAGNOSIS — Z9079 Acquired absence of other genital organ(s): Secondary | ICD-10-CM

## 2017-07-13 MED ORDER — OXYCODONE-ACETAMINOPHEN 5-325 MG PO TABS: 1.0000 | ORAL_TABLET | ORAL | 0 refills | Status: DC | PRN

## 2017-07-13 MED ORDER — DESOGESTREL-ETHINYL ESTRADIOL 0.15-30 MG-MCG PO TABS: 1.0000 | ORAL_TABLET | Freq: Every day | ORAL | 12 refills | Status: DC

## 2017-07-13 MED ORDER — KETOROLAC TROMETHAMINE 10 MG PO TABS: 10.0000 mg | ORAL_TABLET | Freq: Three times a day (TID) | ORAL | 0 refills | Status: DC | PRN

## 2017-07-13 NOTE — Progress Notes (Signed)
  HPI: Patient returns for routine postoperative follow-up having undergone TAH BSO on 07/03/2017.  The patient's immediate postoperative recovery has been unremarkable. Since hospital discharge the patient reports normal bowel bladder function, pain is improving, eating ok, no specific complaints.   Current Outpatient Medications: buPROPion (WELLBUTRIN XL) 150 MG 24 hr tablet, Take 150 mg by mouth daily., Disp: , Rfl:  escitalopram (LEXAPRO) 20 MG tablet, Take 20 mg by mouth at bedtime. , Disp: , Rfl:  HYDROmorphone (DILAUDID) 2 MG tablet, Take 1 tablet (2 mg total) by mouth every 4 (four) hours as needed for severe pain., Disp: 40 tablet, Rfl: 0 ketorolac (TORADOL) 10 MG tablet, Take 1 tablet (10 mg total) by mouth every 8 (eight) hours as needed., Disp: 15 tablet, Rfl: 0 loratadine (CLARITIN) 10 MG tablet, Take 10 mg by mouth daily as needed for allergies. Reported on 07/16/2015, Disp: , Rfl:  ondansetron (ZOFRAN ODT) 8 MG disintegrating tablet, Take 1 tablet (8 mg total) by mouth every 8 (eight) hours as needed for nausea or vomiting., Disp: 20 tablet, Rfl: 0 desogestrel-ethinyl estradiol (APRI,EMOQUETTE,SOLIA) 0.15-30 MG-MCG tablet, Take 1 tablet by mouth daily. Patient will take these continuously, no placebo "off" week so she will get refilled every 3 weeks, Disp: 1 Package, Rfl: 12 Multiple Vitamin (MULTIVITAMIN WITH MINERALS) TABS tablet, Take 1 tablet by mouth daily., Disp: , Rfl:  Omega-3 Fatty Acids (FISH OIL PO), Take 2 tablets by mouth daily., Disp: , Rfl:   No current facility-administered medications for this visit.     Blood pressure 124/76, pulse (!) 102, height 5\' 4"  (1.626 m), weight 180 lb (81.6 kg), last menstrual period 09/04/2015.  Physical Exam: Incision clean dry intact Abdomen soft benign post op exam  Diagnostic Tests:   Pathology: benign  Impression: S/p TAH BSO  Plan: Meds ordered this encounter  Medications  . desogestrel-ethinyl estradiol  (APRI,EMOQUETTE,SOLIA) 0.15-30 MG-MCG tablet    Sig: Take 1 tablet by mouth daily. Patient will take these continuously, no placebo "off" week so she will get refilled every 3 weeks    Dispense:  1 Package    Refill:  12  . oxyCODONE-acetaminophen (PERCOCET/ROXICET) 5-325 MG tablet    Sig: Take 1 tablet by mouth every 4 (four) hours as needed for severe pain.    Dispense:  20 tablet    Refill:  0  . ketorolac (TORADOL) 10 MG tablet    Sig: Take 1 tablet (10 mg total) by mouth every 8 (eight) hours as needed.    Dispense:  15 tablet    Refill:  0     Follow up: 4  weeks  Lazaro ArmsLuther H Demetrie Borge, MD

## 2017-07-27 ENCOUNTER — Encounter: Payer: Self-pay | Admitting: *Deleted

## 2017-07-27 ENCOUNTER — Telehealth: Payer: Self-pay | Admitting: *Deleted

## 2017-07-27 NOTE — Telephone Encounter (Signed)
Larita FifeLynn called stating Selena Mann was summoned for jury duty this morning and is requesting a note stating she recently had surgery so that she could be dismissed or rescheduled. Informed that Dr Despina HiddenEure was not in the office right now, but would send note with surgery date and request for patient to be rescheduled. Stated that was fine.

## 2017-08-13 ENCOUNTER — Encounter: Payer: Self-pay | Admitting: Obstetrics & Gynecology

## 2017-08-13 ENCOUNTER — Ambulatory Visit (INDEPENDENT_AMBULATORY_CARE_PROVIDER_SITE_OTHER): Payer: 59 | Admitting: Obstetrics & Gynecology

## 2017-08-13 ENCOUNTER — Other Ambulatory Visit: Payer: Self-pay

## 2017-08-13 VITALS — BP 132/84 | HR 85 | Ht 64.0 in | Wt 179.0 lb

## 2017-08-13 DIAGNOSIS — Z9079 Acquired absence of other genital organ(s): Secondary | ICD-10-CM

## 2017-08-13 DIAGNOSIS — Z90710 Acquired absence of both cervix and uterus: Secondary | ICD-10-CM

## 2017-08-13 DIAGNOSIS — Z90722 Acquired absence of ovaries, bilateral: Secondary | ICD-10-CM

## 2017-08-13 DIAGNOSIS — Z9889 Other specified postprocedural states: Secondary | ICD-10-CM

## 2017-08-13 MED ORDER — METRONIDAZOLE 0.75 % VA GEL: VAGINAL | 0 refills | Status: DC

## 2017-08-13 MED ORDER — ONDANSETRON 8 MG PO TBDP: 8.0000 mg | ORAL_TABLET | Freq: Three times a day (TID) | ORAL | 3 refills | Status: DC | PRN

## 2017-08-13 NOTE — Progress Notes (Signed)
  HPI: Patient returns for routine postoperative follow-up having undergone TAH BSO on 07/03/2017.  The patient's immediate postoperative recovery has been unremarkable. Since hospital discharge the patient reports some nausea, and gassy discomfort.   Current Outpatient Medications: buPROPion (WELLBUTRIN XL) 150 MG 24 hr tablet, Take 150 mg by mouth daily., Disp: , Rfl:  desogestrel-ethinyl estradiol (APRI,EMOQUETTE,SOLIA) 0.15-30 MG-MCG tablet, Take 1 tablet by mouth daily. Patient will take these continuously, no placebo "off" week so she will get refilled every 3 weeks, Disp: 1 Package, Rfl: 12 escitalopram (LEXAPRO) 20 MG tablet, Take 20 mg by mouth at bedtime. , Disp: , Rfl:  ketorolac (TORADOL) 10 MG tablet, Take 1 tablet (10 mg total) by mouth every 8 (eight) hours as needed., Disp: 15 tablet, Rfl: 0 loratadine (CLARITIN) 10 MG tablet, Take 10 mg by mouth daily as needed for allergies. Reported on 07/16/2015, Disp: , Rfl:  Multiple Vitamin (MULTIVITAMIN WITH MINERALS) TABS tablet, Take 1 tablet by mouth daily., Disp: , Rfl:  Omega-3 Fatty Acids (FISH OIL PO), Take 2 tablets by mouth daily., Disp: , Rfl:  ondansetron (ZOFRAN ODT) 8 MG disintegrating tablet, Take 1 tablet (8 mg total) by mouth every 8 (eight) hours as needed for nausea or vomiting., Disp: 20 tablet, Rfl: 3 HYDROmorphone (DILAUDID) 2 MG tablet, Take 1 tablet (2 mg total) by mouth every 4 (four) hours as needed for severe pain. (Patient not taking: Reported on 08/13/2017), Disp: 40 tablet, Rfl: 0 ketorolac (TORADOL) 10 MG tablet, Take 1 tablet (10 mg total) by mouth every 8 (eight) hours as needed. (Patient not taking: Reported on 08/13/2017), Disp: 15 tablet, Rfl: 0 oxyCODONE-acetaminophen (PERCOCET/ROXICET) 5-325 MG tablet, Take 1 tablet by mouth every 4 (four) hours as needed for severe pain. (Patient not taking: Reported on 08/13/2017), Disp: 20 tablet, Rfl: 0  No current facility-administered medications for this visit.      Blood pressure 132/84, pulse 85, height 5\' 4"  (1.626 m), weight 179 lb (81.2 kg), last menstrual period 09/04/2015.  Physical Exam: Incision clean dry intact  Diagnostic Tests: Abdomen soft normal Vagina is intact  Pathology: benign  Impression: S/p TAH BSO  Plan: Continue Apri  Follow up: 1  years  Lazaro ArmsLuther H Libbey Duce, MD

## 2018-06-08 ENCOUNTER — Other Ambulatory Visit: Payer: Self-pay | Admitting: Obstetrics & Gynecology

## 2019-04-08 ENCOUNTER — Other Ambulatory Visit: Payer: Self-pay

## 2019-04-08 DIAGNOSIS — Z20822 Contact with and (suspected) exposure to covid-19: Secondary | ICD-10-CM

## 2019-04-09 LAB — NOVEL CORONAVIRUS, NAA: SARS-CoV-2, NAA: NOT DETECTED

## 2019-04-15 ENCOUNTER — Other Ambulatory Visit: Payer: Self-pay

## 2019-04-15 DIAGNOSIS — Z20822 Contact with and (suspected) exposure to covid-19: Secondary | ICD-10-CM

## 2019-04-17 LAB — NOVEL CORONAVIRUS, NAA: SARS-CoV-2, NAA: NOT DETECTED

## 2019-04-20 ENCOUNTER — Other Ambulatory Visit: Payer: Self-pay

## 2019-04-20 DIAGNOSIS — Z20822 Contact with and (suspected) exposure to covid-19: Secondary | ICD-10-CM

## 2019-04-22 LAB — NOVEL CORONAVIRUS, NAA: SARS-CoV-2, NAA: NOT DETECTED

## 2019-05-30 ENCOUNTER — Encounter: Payer: Self-pay | Admitting: *Deleted

## 2019-05-31 ENCOUNTER — Other Ambulatory Visit: Payer: Self-pay

## 2019-05-31 ENCOUNTER — Encounter: Payer: Self-pay | Admitting: Obstetrics & Gynecology

## 2019-05-31 ENCOUNTER — Ambulatory Visit (INDEPENDENT_AMBULATORY_CARE_PROVIDER_SITE_OTHER): Payer: Self-pay | Admitting: Obstetrics & Gynecology

## 2019-05-31 VITALS — BP 118/78 | HR 83 | Ht 64.0 in | Wt 184.0 lb

## 2019-05-31 DIAGNOSIS — Z90710 Acquired absence of both cervix and uterus: Secondary | ICD-10-CM

## 2019-05-31 DIAGNOSIS — Z9079 Acquired absence of other genital organ(s): Secondary | ICD-10-CM

## 2019-05-31 DIAGNOSIS — Z90722 Acquired absence of ovaries, bilateral: Secondary | ICD-10-CM

## 2019-05-31 DIAGNOSIS — Z79890 Hormone replacement therapy (postmenopausal): Secondary | ICD-10-CM

## 2019-05-31 DIAGNOSIS — E8940 Asymptomatic postprocedural ovarian failure: Secondary | ICD-10-CM

## 2019-05-31 MED ORDER — DESOGESTREL-ETHINYL ESTRADIOL 0.15-30 MG-MCG PO TABS: ORAL_TABLET | ORAL | 12 refills | Status: DC

## 2019-05-31 NOTE — Progress Notes (Signed)
Chief Complaint  Patient presents with  . Follow-up    wants to discuss HRT      33 y.o. G1P0010 Patient's last menstrual period was 09/04/2015 (approximate). The current method of family planning is status post hysterectomy.  Outpatient Encounter Medications as of 05/31/2019  Medication Sig  . buPROPion (WELLBUTRIN XL) 150 MG 24 hr tablet Take 150 mg by mouth daily.  Marland Kitchen desogestrel-ethinyl estradiol (APRI) 0.15-30 MG-MCG tablet TAKE 1 TABLET BY MOUTH DAILY. PATIENT WILL TAKE THESE CONTINUOUSLY, NO PLACEBO  . escitalopram (LEXAPRO) 20 MG tablet Take 20 mg by mouth at bedtime.   Marland Kitchen loratadine (CLARITIN) 10 MG tablet Take 10 mg by mouth daily as needed for allergies. Reported on 07/16/2015  . Multiple Vitamin (MULTIVITAMIN WITH MINERALS) TABS tablet Take 1 tablet by mouth daily.  . Omega-3 Fatty Acids (FISH OIL PO) Take 2 tablets by mouth daily.  . [DISCONTINUED] APRI 0.15-30 MG-MCG tablet TAKE 1 TABLET BY MOUTH DAILY. PATIENT WILL TAKE THESE CONTINUOUSLY, NO PLACEBO  . [DISCONTINUED] HYDROmorphone (DILAUDID) 2 MG tablet Take 1 tablet (2 mg total) by mouth every 4 (four) hours as needed for severe pain. (Patient not taking: Reported on 08/13/2017)  . [DISCONTINUED] ketorolac (TORADOL) 10 MG tablet Take 1 tablet (10 mg total) by mouth every 8 (eight) hours as needed.  . [DISCONTINUED] ketorolac (TORADOL) 10 MG tablet Take 1 tablet (10 mg total) by mouth every 8 (eight) hours as needed. (Patient not taking: Reported on 08/13/2017)  . [DISCONTINUED] metroNIDAZOLE (METROGEL VAGINAL) 0.75 % vaginal gel Nightly x 5 nights  . [DISCONTINUED] ondansetron (ZOFRAN ODT) 8 MG disintegrating tablet Take 1 tablet (8 mg total) by mouth every 8 (eight) hours as needed for nausea or vomiting.  . [DISCONTINUED] oxyCODONE-acetaminophen (PERCOCET/ROXICET) 5-325 MG tablet Take 1 tablet by mouth every 4 (four) hours as needed for severe pain. (Patient not taking: Reported on 08/13/2017)   No  facility-administered encounter medications on file as of 05/31/2019.    Subjective Follow up on surgical menopausal management Past Medical History:  Diagnosis Date  . Anemia   . Anxiety   . Depression   . Endometriosis   . Pneumonia 2016    Past Surgical History:  Procedure Laterality Date  . ABDOMINAL HYSTERECTOMY Bilateral 07/03/2017   Procedure: HYSTERECTOMY ABDOMINAL WITH BILATERAL SAPINGO-OOPHORECTOMY;  Surgeon: Lazaro Arms, MD;  Location: [facility];  Service: Gynecology;  Laterality: Bilateral;  . EXCISION OF ENDOMETRIOMA    . LAPAROTOMY N/A 07/04/2015   Procedure: EXPLORATORY LAPAROTOMY, REMOVAL OF BILATERAL OVARIAN ENDOMETRIOMAS;  Surgeon: Lazaro Arms, MD;  Location: [facility];  Service: Gynecology;  Laterality: N/A;  . TONSILLECTOMY    . WISDOM TOOTH EXTRACTION  2014    OB History    Gravida  1   Para  0   Term  0   Preterm  0   AB  1   Living  0     SAB  1   TAB  0   Ectopic  0   Multiple  0   Live Births              No Known Allergies  Social History   Socioeconomic History  . Marital status: Single    Spouse name: Not on file  . Number of children: Not on file  . Years of education: Not on file  . Highest education level: Not on file  Occupational History  . Not on file  Tobacco Use  .  Smoking status: Former Smoker    Years: 4.00    Types: Cigarettes  . Smokeless tobacco: Never Used  Substance and Sexual Activity  . Alcohol use: Yes    Comment: occasional  . Drug use: No  . Sexual activity: Not Currently    Birth control/protection: Pill    Comment: female partner  Other Topics Concern  . Not on file  Social History Narrative  . Not on file   Social Determinants of Health   Financial Resource Strain:   . Difficulty of Paying Living Expenses: Not on file  Food Insecurity:   . Worried About Programme researcher, broadcasting/film/videounning Out of Food in the Last Year: Not on file  . Ran Out of Food in the Last Year: Not on file  Transportation Needs:   . Lack  of Transportation (Medical): Not on file  . Lack of Transportation (Non-Medical): Not on file  Physical Activity:   . Days of Exercise per Week: Not on file  . Minutes of Exercise per Session: Not on file  Stress:   . Feeling of Stress : Not on file  Social Connections:   . Frequency of Communication with Friends and Family: Not on file  . Frequency of Social Gatherings with Friends and Family: Not on file  . Attends Religious Services: Not on file  . Active Member of Clubs or Organizations: Not on file  . Attends BankerClub or Organization Meetings: Not on file  . Marital Status: Not on file    Family History  Problem Relation Age of Onset  . Anxiety disorder Mother   . Asthma Mother   . Other Father        heart defect  . Hypertension Father   . Hyperlipidemia Father   . Diabetes Father        pre-diabetes  . Other Brother        heart defect  . Diabetes Maternal Grandmother   . Hyperthyroidism Maternal Grandmother        hypothyroid after surgery  . Stroke Maternal Grandmother   . Cancer Maternal Grandfather   . Diabetes Paternal Grandmother   . Other Brother        heart defect    Medications:       Current Outpatient Medications:  .  buPROPion (WELLBUTRIN XL) 150 MG 24 hr tablet, Take 150 mg by mouth daily., Disp: , Rfl:  .  desogestrel-ethinyl estradiol (APRI) 0.15-30 MG-MCG tablet, TAKE 1 TABLET BY MOUTH DAILY. PATIENT WILL TAKE THESE CONTINUOUSLY, NO PLACEBO, Disp: 28 tablet, Rfl: 12 .  escitalopram (LEXAPRO) 20 MG tablet, Take 20 mg by mouth at bedtime. , Disp: , Rfl:  .  loratadine (CLARITIN) 10 MG tablet, Take 10 mg by mouth daily as needed for allergies. Reported on 07/16/2015, Disp: , Rfl:  .  Multiple Vitamin (MULTIVITAMIN WITH MINERALS) TABS tablet, Take 1 tablet by mouth daily., Disp: , Rfl:  .  Omega-3 Fatty Acids (FISH OIL PO), Take 2 tablets by mouth daily., Disp: , Rfl:   Objective Blood pressure 118/78, pulse 83, height 5\' 4"  (1.626 m), weight 184 lb  (83.5 kg), last menstrual period 09/04/2015.  Gen WDWN NAD  Pertinent ROS No burning with urination, frequency or urgency No nausea, vomiting or diarrhea Nor fever chills or other constitutional symptoms   Labs or studies     Impression Diagnoses this Encounter::   ICD-10-CM   1. Surgical menopause on hormone replacement therapy  E89.40    Z79.890   2. S/P TAH-BSO  Z90.710    Z90.722    Z90.79     Established relevant diagnosis(es):   Plan/Recommendations: Meds ordered this encounter  Medications  . desogestrel-ethinyl estradiol (APRI) 0.15-30 MG-MCG tablet    Sig: TAKE 1 TABLET BY MOUTH DAILY. PATIENT WILL TAKE THESE CONTINUOUSLY, NO PLACEBO    Dispense:  28 tablet    Refill:  12    Labs or Scans Ordered: No orders of the defined types were placed in this encounter.   Management:: Continue with 30 mic OCP  Follow up No follow-ups on file.        Face to face time:  10 minutes  Greater than 50% of the visit time was spent in counseling and coordination of care with the patient.  The summary and outline of the counseling and care coordination is summarized in the note above.   All questions were answered.

## 2019-08-22 ENCOUNTER — Encounter: Payer: Self-pay | Admitting: Obstetrics & Gynecology

## 2020-04-26 ENCOUNTER — Other Ambulatory Visit: Payer: Self-pay | Admitting: Obstetrics & Gynecology

## 2021-03-17 ENCOUNTER — Other Ambulatory Visit: Payer: Self-pay | Admitting: Obstetrics & Gynecology

## 2021-07-16 DIAGNOSIS — Z62810 Personal history of physical and sexual abuse in childhood: Secondary | ICD-10-CM | POA: Diagnosis not present

## 2021-07-16 DIAGNOSIS — Z62811 Personal history of psychological abuse in childhood: Secondary | ICD-10-CM | POA: Diagnosis not present

## 2021-07-16 DIAGNOSIS — F341 Dysthymic disorder: Secondary | ICD-10-CM | POA: Diagnosis not present

## 2021-07-30 DIAGNOSIS — Z62811 Personal history of psychological abuse in childhood: Secondary | ICD-10-CM | POA: Diagnosis not present

## 2021-07-30 DIAGNOSIS — F341 Dysthymic disorder: Secondary | ICD-10-CM | POA: Diagnosis not present

## 2021-07-30 DIAGNOSIS — Z62810 Personal history of physical and sexual abuse in childhood: Secondary | ICD-10-CM | POA: Diagnosis not present

## 2021-08-13 DIAGNOSIS — Z62811 Personal history of psychological abuse in childhood: Secondary | ICD-10-CM | POA: Diagnosis not present

## 2021-08-13 DIAGNOSIS — Z62810 Personal history of physical and sexual abuse in childhood: Secondary | ICD-10-CM | POA: Diagnosis not present

## 2021-08-13 DIAGNOSIS — F341 Dysthymic disorder: Secondary | ICD-10-CM | POA: Diagnosis not present

## 2021-08-27 DIAGNOSIS — Z62811 Personal history of psychological abuse in childhood: Secondary | ICD-10-CM | POA: Diagnosis not present

## 2021-08-27 DIAGNOSIS — F341 Dysthymic disorder: Secondary | ICD-10-CM | POA: Diagnosis not present

## 2021-08-27 DIAGNOSIS — Z62810 Personal history of physical and sexual abuse in childhood: Secondary | ICD-10-CM | POA: Diagnosis not present

## 2021-09-10 DIAGNOSIS — F341 Dysthymic disorder: Secondary | ICD-10-CM | POA: Diagnosis not present

## 2021-09-10 DIAGNOSIS — Z62811 Personal history of psychological abuse in childhood: Secondary | ICD-10-CM | POA: Diagnosis not present

## 2021-09-10 DIAGNOSIS — Z62810 Personal history of physical and sexual abuse in childhood: Secondary | ICD-10-CM | POA: Diagnosis not present

## 2021-10-08 DIAGNOSIS — Z62811 Personal history of psychological abuse in childhood: Secondary | ICD-10-CM | POA: Diagnosis not present

## 2021-10-08 DIAGNOSIS — F341 Dysthymic disorder: Secondary | ICD-10-CM | POA: Diagnosis not present

## 2021-10-08 DIAGNOSIS — Z62810 Personal history of physical and sexual abuse in childhood: Secondary | ICD-10-CM | POA: Diagnosis not present

## 2021-10-22 DIAGNOSIS — F341 Dysthymic disorder: Secondary | ICD-10-CM | POA: Diagnosis not present

## 2021-10-22 DIAGNOSIS — Z62811 Personal history of psychological abuse in childhood: Secondary | ICD-10-CM | POA: Diagnosis not present

## 2021-10-22 DIAGNOSIS — Z62810 Personal history of physical and sexual abuse in childhood: Secondary | ICD-10-CM | POA: Diagnosis not present

## 2021-10-23 DIAGNOSIS — F900 Attention-deficit hyperactivity disorder, predominantly inattentive type: Secondary | ICD-10-CM | POA: Diagnosis not present

## 2021-10-23 DIAGNOSIS — F3342 Major depressive disorder, recurrent, in full remission: Secondary | ICD-10-CM | POA: Diagnosis not present

## 2021-11-05 DIAGNOSIS — Z62810 Personal history of physical and sexual abuse in childhood: Secondary | ICD-10-CM | POA: Diagnosis not present

## 2021-11-05 DIAGNOSIS — Z62811 Personal history of psychological abuse in childhood: Secondary | ICD-10-CM | POA: Diagnosis not present

## 2021-11-05 DIAGNOSIS — F341 Dysthymic disorder: Secondary | ICD-10-CM | POA: Diagnosis not present

## 2021-11-19 DIAGNOSIS — Z62810 Personal history of physical and sexual abuse in childhood: Secondary | ICD-10-CM | POA: Diagnosis not present

## 2021-11-19 DIAGNOSIS — Z62811 Personal history of psychological abuse in childhood: Secondary | ICD-10-CM | POA: Diagnosis not present

## 2021-11-19 DIAGNOSIS — F341 Dysthymic disorder: Secondary | ICD-10-CM | POA: Diagnosis not present

## 2021-12-03 DIAGNOSIS — F341 Dysthymic disorder: Secondary | ICD-10-CM | POA: Diagnosis not present

## 2021-12-03 DIAGNOSIS — Z62810 Personal history of physical and sexual abuse in childhood: Secondary | ICD-10-CM | POA: Diagnosis not present

## 2021-12-03 DIAGNOSIS — Z62811 Personal history of psychological abuse in childhood: Secondary | ICD-10-CM | POA: Diagnosis not present

## 2021-12-17 DIAGNOSIS — F341 Dysthymic disorder: Secondary | ICD-10-CM | POA: Diagnosis not present

## 2021-12-17 DIAGNOSIS — Z62810 Personal history of physical and sexual abuse in childhood: Secondary | ICD-10-CM | POA: Diagnosis not present

## 2021-12-17 DIAGNOSIS — Z62811 Personal history of psychological abuse in childhood: Secondary | ICD-10-CM | POA: Diagnosis not present

## 2022-01-02 DIAGNOSIS — Z62810 Personal history of physical and sexual abuse in childhood: Secondary | ICD-10-CM | POA: Diagnosis not present

## 2022-01-02 DIAGNOSIS — F341 Dysthymic disorder: Secondary | ICD-10-CM | POA: Diagnosis not present

## 2022-01-02 DIAGNOSIS — Z62811 Personal history of psychological abuse in childhood: Secondary | ICD-10-CM | POA: Diagnosis not present

## 2022-01-14 DIAGNOSIS — F341 Dysthymic disorder: Secondary | ICD-10-CM | POA: Diagnosis not present

## 2022-01-14 DIAGNOSIS — Z62810 Personal history of physical and sexual abuse in childhood: Secondary | ICD-10-CM | POA: Diagnosis not present

## 2022-01-14 DIAGNOSIS — Z62811 Personal history of psychological abuse in childhood: Secondary | ICD-10-CM | POA: Diagnosis not present

## 2022-01-28 DIAGNOSIS — Z62811 Personal history of psychological abuse in childhood: Secondary | ICD-10-CM | POA: Diagnosis not present

## 2022-01-28 DIAGNOSIS — Z62810 Personal history of physical and sexual abuse in childhood: Secondary | ICD-10-CM | POA: Diagnosis not present

## 2022-01-28 DIAGNOSIS — F341 Dysthymic disorder: Secondary | ICD-10-CM | POA: Diagnosis not present

## 2022-01-31 ENCOUNTER — Other Ambulatory Visit: Payer: Self-pay | Admitting: Obstetrics & Gynecology

## 2022-02-20 DIAGNOSIS — R509 Fever, unspecified: Secondary | ICD-10-CM | POA: Diagnosis not present

## 2022-02-20 DIAGNOSIS — R52 Pain, unspecified: Secondary | ICD-10-CM | POA: Diagnosis not present

## 2022-02-20 DIAGNOSIS — J029 Acute pharyngitis, unspecified: Secondary | ICD-10-CM | POA: Diagnosis not present

## 2022-02-25 DIAGNOSIS — F341 Dysthymic disorder: Secondary | ICD-10-CM | POA: Diagnosis not present

## 2022-02-25 DIAGNOSIS — Z62811 Personal history of psychological abuse in childhood: Secondary | ICD-10-CM | POA: Diagnosis not present

## 2022-02-25 DIAGNOSIS — Z62810 Personal history of physical and sexual abuse in childhood: Secondary | ICD-10-CM | POA: Diagnosis not present

## 2022-03-11 DIAGNOSIS — Z62811 Personal history of psychological abuse in childhood: Secondary | ICD-10-CM | POA: Diagnosis not present

## 2022-03-11 DIAGNOSIS — Z62810 Personal history of physical and sexual abuse in childhood: Secondary | ICD-10-CM | POA: Diagnosis not present

## 2022-03-11 DIAGNOSIS — F341 Dysthymic disorder: Secondary | ICD-10-CM | POA: Diagnosis not present

## 2022-03-25 DIAGNOSIS — F341 Dysthymic disorder: Secondary | ICD-10-CM | POA: Diagnosis not present

## 2022-03-25 DIAGNOSIS — Z62810 Personal history of physical and sexual abuse in childhood: Secondary | ICD-10-CM | POA: Diagnosis not present

## 2022-03-25 DIAGNOSIS — Z62811 Personal history of psychological abuse in childhood: Secondary | ICD-10-CM | POA: Diagnosis not present

## 2022-04-08 DIAGNOSIS — F341 Dysthymic disorder: Secondary | ICD-10-CM | POA: Diagnosis not present

## 2022-04-08 DIAGNOSIS — Z62810 Personal history of physical and sexual abuse in childhood: Secondary | ICD-10-CM | POA: Diagnosis not present

## 2022-04-08 DIAGNOSIS — Z62811 Personal history of psychological abuse in childhood: Secondary | ICD-10-CM | POA: Diagnosis not present

## 2022-04-22 DIAGNOSIS — Z62811 Personal history of psychological abuse in childhood: Secondary | ICD-10-CM | POA: Diagnosis not present

## 2022-04-22 DIAGNOSIS — Z62810 Personal history of physical and sexual abuse in childhood: Secondary | ICD-10-CM | POA: Diagnosis not present

## 2022-04-22 DIAGNOSIS — F341 Dysthymic disorder: Secondary | ICD-10-CM | POA: Diagnosis not present

## 2022-05-06 DIAGNOSIS — Z62810 Personal history of physical and sexual abuse in childhood: Secondary | ICD-10-CM | POA: Diagnosis not present

## 2022-05-06 DIAGNOSIS — Z62811 Personal history of psychological abuse in childhood: Secondary | ICD-10-CM | POA: Diagnosis not present

## 2022-05-06 DIAGNOSIS — F341 Dysthymic disorder: Secondary | ICD-10-CM | POA: Diagnosis not present

## 2022-05-20 DIAGNOSIS — Z62811 Personal history of psychological abuse in childhood: Secondary | ICD-10-CM | POA: Diagnosis not present

## 2022-05-20 DIAGNOSIS — Z62810 Personal history of physical and sexual abuse in childhood: Secondary | ICD-10-CM | POA: Diagnosis not present

## 2022-05-20 DIAGNOSIS — F341 Dysthymic disorder: Secondary | ICD-10-CM | POA: Diagnosis not present

## 2022-06-03 DIAGNOSIS — F640 Transsexualism: Secondary | ICD-10-CM | POA: Diagnosis not present

## 2022-06-03 DIAGNOSIS — Z62810 Personal history of physical and sexual abuse in childhood: Secondary | ICD-10-CM | POA: Diagnosis not present

## 2022-06-03 DIAGNOSIS — F341 Dysthymic disorder: Secondary | ICD-10-CM | POA: Diagnosis not present

## 2022-06-03 DIAGNOSIS — Z62811 Personal history of psychological abuse in childhood: Secondary | ICD-10-CM | POA: Diagnosis not present

## 2022-06-17 DIAGNOSIS — F341 Dysthymic disorder: Secondary | ICD-10-CM | POA: Diagnosis not present

## 2022-06-17 DIAGNOSIS — F640 Transsexualism: Secondary | ICD-10-CM | POA: Diagnosis not present

## 2022-06-17 DIAGNOSIS — Z62810 Personal history of physical and sexual abuse in childhood: Secondary | ICD-10-CM | POA: Diagnosis not present

## 2022-06-17 DIAGNOSIS — Z62811 Personal history of psychological abuse in childhood: Secondary | ICD-10-CM | POA: Diagnosis not present

## 2022-10-13 DIAGNOSIS — F3342 Major depressive disorder, recurrent, in full remission: Secondary | ICD-10-CM | POA: Diagnosis not present

## 2022-10-13 DIAGNOSIS — F900 Attention-deficit hyperactivity disorder, predominantly inattentive type: Secondary | ICD-10-CM | POA: Diagnosis not present

## 2023-01-10 ENCOUNTER — Other Ambulatory Visit: Payer: Self-pay | Admitting: Obstetrics & Gynecology

## 2023-04-17 DIAGNOSIS — F3342 Major depressive disorder, recurrent, in full remission: Secondary | ICD-10-CM | POA: Diagnosis not present

## 2023-04-17 DIAGNOSIS — F332 Major depressive disorder, recurrent severe without psychotic features: Secondary | ICD-10-CM | POA: Diagnosis not present

## 2023-04-17 DIAGNOSIS — F900 Attention-deficit hyperactivity disorder, predominantly inattentive type: Secondary | ICD-10-CM | POA: Diagnosis not present

## 2023-06-20 ENCOUNTER — Telehealth: Payer: BC Managed Care – PPO | Admitting: Family Medicine

## 2023-06-20 DIAGNOSIS — R229 Localized swelling, mass and lump, unspecified: Secondary | ICD-10-CM

## 2023-06-20 MED ORDER — DOXYCYCLINE HYCLATE 100 MG PO TABS: 100.0000 mg | ORAL_TABLET | Freq: Two times a day (BID) | ORAL | 0 refills | Status: AC

## 2023-06-20 NOTE — Progress Notes (Signed)
Virtual Visit Consent   Argentina Stadtler, you are scheduled for a virtual visit with a [facility] provider today. Just as with appointments in the office, your consent must be obtained to participate. Your consent will be active for this visit and any virtual visit you may have with one of our providers in the next 365 days. If you have a MyChart account, a copy of this consent can be sent to you electronically.  As this is a virtual visit, video technology does not allow for your provider to perform a traditional examination. This may limit your provider's ability to fully assess your condition. If your provider identifies any concerns that need to be evaluated in person or the need to arrange testing (such as labs, EKG, etc.), we will make arrangements to do so. Although advances in technology are sophisticated, we cannot ensure that it will always work on either your end or our end. If the connection with a video visit is poor, the visit may have to be switched to a telephone visit. With either a video or telephone visit, we are not always able to ensure that we have a secure connection.  By engaging in this virtual visit, you consent to the provision of healthcare and authorize for your insurance to be billed (if applicable) for the services provided during this visit. Depending on your insurance coverage, you may receive a charge related to this service.  I need to obtain your verbal consent now. Are you willing to proceed with your visit today? Selena Mann has provided verbal consent on 06/20/2023 for a virtual visit (video or telephone). [name], [place]  Date: 06/20/2023 12:19 PM  Virtual Visit via Video Note   IReed Pandy, connected with  Selena Mann  ([id number], Nov 14, 1985) on 06/20/23 at 12:15 PM EST by a video-enabled telemedicine application and verified that I am speaking with the correct person using two identifiers.  Location: Patient: Virtual Visit Location Patient:  Home Provider: Virtual Visit Location Provider: Home Office   I discussed the limitations of evaluation and management by telemedicine and the availability of in person appointments. The patient expressed understanding and agreed to proceed.    History of Present Illness: Selena Mann is a 37 y.o. who identifies as a female who was assigned female at birth, and is being seen today for c/o waking up with a boil under her underarm.  Pt states she has a boil one other time and had an infection.  Pt states it is not draining yet. Pt states boil is painful and is on the right side of her under arm.  Pt denies fever or chills and is not feeling ill. Pt states last time she had a boil it was under her breast.   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 , Disp: , Rfl:  ISIBLOOM 0.15-30 MG-MCG tablet, TAKE 1 TABLET BY MOUTH DAILY. PATIENT TO TAKE CONTINUOUSLY. SKIP PLACEBO, Disp: 84 tablet, Rfl: 4   loratadine (CLARITIN) 10 MG tablet, Take 10 mg by mouth daily as needed for allergies. Reported on 07/16/2015, Disp: , Rfl:    Multiple Vitamin (MULTIVITAMIN WITH MINERALS) TABS tablet, Take 1 tablet by mouth daily., Disp: , Rfl:    Omega-3 Fatty Acids (FISH OIL PO), Take 2 tablets by mouth daily., Disp: , Rfl:   Observations/Objective: Patient is well-developed,  well-nourished in no acute distress.  Resting comfortably at home.  Head is normocephalic, atraumatic.  No labored breathing.  Speech is clear and coherent with logical content.  Patient is alert and oriented at baseline.    Assessment and Plan: 1. Localized superficial swelling, mass, or lump (Primary) - doxycycline (VIBRA-TABS) 100 MG tablet; Take 1 tablet (100 mg total) by mouth 2 (two) times daily for 10 days.  Dispense: 20 tablet; Refill: 0  -Discussed with patient if this is reoccurring she should follow up with PCP for further evaluation  -Start Doxycycline  -Pt to follow up if worsening symptoms  -Pt verbalized understanding.   Follow Up Instructions: I discussed the assessment and treatment plan with the patient. The patient was provided an opportunity to ask questions and all were answered. The patient agreed with the plan and demonstrated an understanding of the instructions.  A copy of instructions were sent to the patient via MyChart unless otherwise noted below.     The patient was advised to call back or seek an in-person evaluation if the symptoms worsen or if the condition fails to improve as anticipated.    [name], PA-C

## 2023-06-20 NOTE — Patient Instructions (Signed)
Selena Mann, thank you for joining [name], PA-C for today's virtual visit.  While this provider is not your primary care provider (PCP), if your PCP is located in our provider database this encounter information will be shared with them immediately following your visit.   A [facility] MyChart account gives you access to today's visit and all your visits, tests, and labs performed at [facility] " click here if you don't have a [facility] MyChart account or go to [email or web address]  Consent: (Patient) Selena Mann provided verbal consent for this virtual visit at the beginning of the encounter.  Current Medications:  Current Outpatient Medications:    doxycycline (VIBRA-TABS) 100 MG tablet, Take 1 tablet (100 mg total) by mouth 2 (two) times daily for 10 days., Disp: 20 tablet, Rfl: 0   buPROPion (WELLBUTRIN XL) 150 MG 24 hr tablet, Take 150 mg by mouth daily., Disp: , Rfl:    escitalopram (LEXAPRO) 20 MG tablet, Take 20 mg by mouth at bedtime. , Disp: , Rfl:    ISIBLOOM 0.15-30 MG-MCG tablet, TAKE 1 TABLET BY MOUTH DAILY. PATIENT TO TAKE CONTINUOUSLY. SKIP PLACEBO, Disp: 84 tablet, Rfl: 4   loratadine (CLARITIN) 10 MG tablet, Take 10 mg by mouth daily as needed for allergies. Reported on 07/16/2015, Disp: , Rfl:    Multiple Vitamin (MULTIVITAMIN WITH MINERALS) TABS tablet, Take 1 tablet by mouth daily., Disp: , Rfl:    Omega-3 Fatty Acids (FISH OIL PO), Take 2 tablets by mouth daily., Disp: , Rfl:    Medications ordered in this encounter:  Meds ordered this encounter  Medications   doxycycline (VIBRA-TABS) 100 MG tablet    Sig: Take 1 tablet (100 mg total) by mouth 2 (two) times daily for 10 days.    Dispense:  20 tablet    Refill:  0     *If you need refills on other medications prior to your next appointment, please contact your pharmacy*  Follow-Up: Call back or seek an in-person evaluation if the symptoms worsen or if the condition fails to improve as  anticipated.  [facility] [phone]  Other Instructions Skin Abscess  A skin abscess is an infected area on or under your skin. It contains pus and other material. An abscess may also be called a furuncle, carbuncle, or boil. It is often the result of an infection caused by bacteria. An abscess can occur in or on almost any part of your body. Sometimes, an abscess may break open (rupture) on its own. In most cases, it will keep getting worse unless it is treated. An abscess can cause pain and make you feel ill. An untreated abscess can cause infection to spread to other parts of your body or your bloodstream. The abscess may need to be drained. You may also need to take antibiotics. What are the causes? An abscess occurs when germs, like bacteria, pass through your skin and cause an infection. This may be caused by: A scrape or cut on your skin. A puncture wound through your skin, such as a needle injection or insect bite. Blocked oil or sweat glands. Blocked and infected hair follicles. A fluid-filled sac that forms beneath your skin (sebaceous cyst) and becomes infected. What increases the risk? You may be more likely to develop an abscess if: You have problems with blood circulation, or you have a weak body defense system (immune system). You have diabetes. You have dry and irritated skin. You get injections often or use IV  drugs. You have a foreign body in a wound, such as a splinter. You smoke or use tobacco products. What are the signs or symptoms? Symptoms of this condition include: A painful, firm bump under the skin. A bump with pus at the top. This may break through the skin and drain. Other symptoms include: Redness and swelling around the abscess. Warmth or tenderness. Swelling of the lymph nodes (glands) near the abscess. A sore on the skin. How is this diagnosed? This condition may be diagnosed based on a physical exam and your medical history. You  may also have tests done, such as: A test of a sample of pus. This may be done to find what is causing the infection. Blood tests. Imaging tests, such as an ultrasound, CT scan, or MRI. How is this treated? A small abscess that drains on its own may not need to be treated. Treatment for larger abscesses may include: Moist heat or a heat pack applied to the area a few times a day. Incision and drainage. This is a procedure to drain the abscess. Antibiotics. For a severe abscess, you may first get antibiotics through an IV and then change to antibiotics by mouth. Follow these instructions at home: Medicines Take over-the-counter and prescription medicines only as told by your provider. If you were prescribed antibiotics, take them as told by your provider. Do not stop using the antibiotic even if you start to feel better. Abscess care  If you have an abscess that has not drained, apply heat to the affected area. Use the heat source that your provider recommends, such as a moist heat pack or a heating pad. Place a towel between your skin and the heat source. Leave the heat on for 20-30 minutes at a time. If your skin turns bright red, remove the heat right away to prevent burns. The risk of burns is higher if you cannot feel pain, heat, or cold. Follow instructions from your provider about how to take care of your abscess. Make sure you: Cover the abscess with a bandage (dressing). Wash your hands with soap and water for at least 20 seconds before and after you change the dressing or gauze. If soap and water are not available, use hand sanitizer. Change your dressing or gauze as told by your provider. Check your abscess every day for signs of an infection that is getting worse. Check for: More redness, swelling, pain, or tenderness. More fluid or blood. Warmth. More pus or a worse smell. General instructions To avoid spreading the infection: Do not share personal care items, towels, or hot  tubs with others. Avoid making skin contact with other people. Be careful when getting rid of used dressings, wound packing, or any drainage from the abscess. Do not use any products that contain nicotine or tobacco. These products include cigarettes, chewing tobacco, and vaping devices, such as e-cigarettes. If you need help quitting, ask your provider. Do not use any creams, ointments, or liquids unless you have been told to by your provider. Contact a health care provider if: You see redness that spreads quickly or red streaks on your skin spreading away from the abscess. You have any signs of worse infection at the abscess. You vomit every time you eat or drink. You have a fever, chills, or muscle aches. The cyst or abscess returns. Get help right away if: You have severe pain. You make less pee (urine) than normal. This information is not intended to replace advice given to you by  your health care provider. Make sure you discuss any questions you have with your health care provider. Document Revised: 01/29/2022 Document Reviewed: 01/29/2022 Elsevier Patient Education  2024 [employer]    If you have been instructed to have an in-person evaluation today at a local Urgent Care facility, please use the link below. It will take you to a list of all of our available [facility], including address, phone number and hours of operation. Please do not delay care.  [facility]  If you or a family member do not have a primary care provider, use the link below to schedule a visit and establish care. When you choose a [facility] primary care physician or advanced practice provider, you gain a long-term partner in health. Find a Primary Care Provider  Learn more about [facility]'s in-office and virtual care options:  - Get Care Now

## 2023-10-05 DIAGNOSIS — F3342 Major depressive disorder, recurrent, in full remission: Secondary | ICD-10-CM | POA: Diagnosis not present

## 2023-10-05 DIAGNOSIS — F900 Attention-deficit hyperactivity disorder, predominantly inattentive type: Secondary | ICD-10-CM | POA: Diagnosis not present

## 2024-01-17 ENCOUNTER — Other Ambulatory Visit: Payer: Self-pay | Admitting: Obstetrics & Gynecology

## 2024-05-24 DIAGNOSIS — F3342 Major depressive disorder, recurrent, in full remission: Secondary | ICD-10-CM | POA: Diagnosis not present

## 2024-05-24 DIAGNOSIS — F900 Attention-deficit hyperactivity disorder, predominantly inattentive type: Secondary | ICD-10-CM | POA: Diagnosis not present
